# Patient Record
Sex: Male | Born: 1975 | Race: White | Hispanic: No | Smoking: Never smoker
Health system: Southern US, Community
[De-identification: ages and names within clinical notes are randomized; demographics above are authoritative.]

## PROBLEM LIST (undated history)

## (undated) DIAGNOSIS — M5126 Other intervertebral disc displacement, lumbar region: Secondary | ICD-10-CM

## (undated) HISTORY — PX: OTHER SURGICAL HISTORY: SHX169

## (undated) HISTORY — DX: Other intervertebral disc displacement, lumbar region: M51.26

## (undated) HISTORY — PX: KNEE SURGERY: SHX244

---

## 2002-05-12 ENCOUNTER — Inpatient Hospital Stay (HOSPITAL_COMMUNITY): Admission: EM | Admit: 2002-05-12 | Discharge: 2002-05-13 | Payer: Self-pay | Admitting: Emergency Medicine

## 2004-12-27 ENCOUNTER — Emergency Department (HOSPITAL_COMMUNITY): Admission: EM | Admit: 2004-12-27 | Discharge: 2004-12-27 | Payer: Self-pay | Admitting: Emergency Medicine

## 2005-01-23 ENCOUNTER — Ambulatory Visit (HOSPITAL_COMMUNITY): Admission: RE | Admit: 2005-01-23 | Discharge: 2005-01-23 | Payer: Self-pay | Admitting: Orthopedic Surgery

## 2006-01-10 ENCOUNTER — Ambulatory Visit (HOSPITAL_COMMUNITY): Admission: RE | Admit: 2006-01-10 | Discharge: 2006-01-10 | Payer: Self-pay | Admitting: Orthopedic Surgery

## 2013-03-14 ENCOUNTER — Ambulatory Visit (INDEPENDENT_AMBULATORY_CARE_PROVIDER_SITE_OTHER): Payer: Managed Care, Other (non HMO)

## 2013-03-14 ENCOUNTER — Encounter: Payer: Self-pay | Admitting: General Practice

## 2013-03-14 ENCOUNTER — Ambulatory Visit (INDEPENDENT_AMBULATORY_CARE_PROVIDER_SITE_OTHER): Payer: Managed Care, Other (non HMO) | Admitting: General Practice

## 2013-03-14 VITALS — BP 106/66 | HR 56 | Temp 97.4°F | Ht 73.5 in | Wt 216.0 lb

## 2013-03-14 DIAGNOSIS — M545 Low back pain, unspecified: Secondary | ICD-10-CM

## 2013-03-14 DIAGNOSIS — M62838 Other muscle spasm: Secondary | ICD-10-CM

## 2013-03-14 MED ORDER — IBUPROFEN 600 MG PO TABS: 600.0000 mg | ORAL_TABLET | Freq: Three times a day (TID) | ORAL | Status: DC | PRN

## 2013-03-14 MED ORDER — CYCLOBENZAPRINE HCL 5 MG PO TABS: 5.0000 mg | ORAL_TABLET | Freq: Two times a day (BID) | ORAL | Status: DC | PRN

## 2013-03-14 NOTE — Progress Notes (Signed)
  Subjective:    Patient ID: Skanda Worlds, male    DOB: 12-05-1975, 37 y.o.   MRN: [id number]  Back Pain The current episode started more than 1 month ago. The problem occurs constantly. The problem has been gradually worsening since onset. The pain is present in the lumbar spine. The quality of the pain is described as aching (sharp periodically). The pain does not radiate. The pain is at a severity of 4/10. The pain is mild. The pain is the same all the time. The symptoms are aggravated by standing and bending (deep breathing). Pertinent negatives include no abdominal pain, bladder incontinence, bowel incontinence, chest pain, dysuria, fever, headaches, leg pain, numbness, paresthesias, pelvic pain, tingling or weakness. He has tried nothing for the symptoms.  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 Reports back pain when taking a deep breath. Reports having a bowel movement twice daily.     Review of Systems  Constitutional: Negative for fever and chills.  HENT: Negative for neck pain.   Respiratory: Negative for chest tightness and shortness of breath.   Cardiovascular: Negative for chest pain and palpitations.  Gastrointestinal: Negative for abdominal pain and bowel incontinence.  Genitourinary: Negative for bladder incontinence, dysuria and pelvic pain.  Musculoskeletal: Positive for back pain.  Neurological: Negative for dizziness, tingling, weakness, numbness, headaches and paresthesias.       Objective:   Physical Exam  Constitutional: He is oriented to person, place, and time. He appears well-developed and well-nourished.  Cardiovascular: Normal rate, regular rhythm and normal heart sounds.   Pulmonary/Chest: Effort normal and breath sounds normal. No respiratory distress. He exhibits no tenderness.  Abdominal: Soft. Bowel sounds are normal. He exhibits no distension. There is no tenderness.  Musculoskeletal: He exhibits no tenderness.  Neurological: He is alert and  oriented to person, place, and time.  Skin: Skin is warm and dry.  Psychiatric: He has a normal mood and affect.   [facility] reading (PRIMARY) by Coralie Keens, FNP-C, no fracture or dislocation noted.                                       Assessment & Plan:  .1. Low back pain - DG Lumbar Spine 2-3 Views; Future - ibuprofen (ADVIL,MOTRIN) 600 MG tablet; Take 1 tablet (600 mg total) by mouth every 8 (eight) hours as needed for pain.  Dispense: 30 tablet; Refill: 0  2. Muscle spasm - cyclobenzaprine (FLEXERIL) 5 MG tablet; Take 1 tablet (5 mg total) by mouth 2 (two) times daily as needed for muscle spasms.  Dispense: 30 tablet; Refill: 0 -Rest affected area -apply heat to affected area for 10-15 minutes three times daily -RTO if symptoms worsen -will refer to ortho if no improvement -Patient verbalized understanding -Coralie Keens, FNP-C

## 2013-03-14 NOTE — Patient Instructions (Signed)
Back Pain, Adult  Low back pain is very common. About 1 in 5 people have back pain. The cause of low back pain is rarely dangerous. The pain often gets better over time. About half of people with a sudden onset of back pain feel better in just 2 weeks. About 8 in 10 people feel better by 6 weeks.   CAUSES  Some common causes of back pain include:  · Strain of the muscles or ligaments supporting the spine.  · Wear and tear (degeneration) of the spinal discs.  · Arthritis.  · Direct injury to the back.  DIAGNOSIS  Most of the time, the direct cause of low back pain is not known. However, back pain can be treated effectively even when the exact cause of the pain is unknown. Answering your caregiver's questions about your overall health and symptoms is one of the most accurate ways to make sure the cause of your pain is not dangerous. If your caregiver needs more information, he or she may order lab work or imaging tests (X-rays or MRIs). However, even if imaging tests show changes in your back, this usually does not require surgery.  HOME CARE INSTRUCTIONS  For many people, back pain returns. Since low back pain is rarely dangerous, it is often a condition that people can learn to manage on their own.   · Remain active. It is stressful on the back to sit or stand in one place. Do not sit, drive, or stand in one place for more than 30 minutes at a time. Take short walks on level surfaces as soon as pain allows. Try to increase the length of time you walk each day.  · Do not stay in bed. Resting more than 1 or 2 days can delay your recovery.  · Do not avoid exercise or work. Your body is made to move. It is not dangerous to be active, even though your back may hurt. Your back will likely heal faster if you return to being active before your pain is gone.  · Pay attention to your body when you  bend and lift. Many people have less discomfort when lifting if they bend their knees, keep the load close to their bodies, and  avoid twisting. Often, the most comfortable positions are those that put less stress on your recovering back.  · Find a comfortable position to sleep. Use a firm mattress and lie on your side with your knees slightly bent. If you lie on your back, put a pillow under your knees.  · Only take over-the-counter or prescription medicines as directed by your caregiver. Over-the-counter medicines to reduce pain and inflammation are often the most helpful. Your caregiver may prescribe muscle relaxant drugs. These medicines help dull your pain so you can more quickly return to your normal activities and healthy exercise.  · Put ice on the injured area.  · Put ice in a plastic bag.  · Place a towel between your skin and the bag.  · Leave the ice on for 15-20 minutes, 3-4 times a day for the first 2 to 3 days. After that, ice and heat may be alternated to reduce pain and spasms.  · Ask your caregiver about trying back exercises and gentle massage. This may be of some benefit.  · Avoid feeling anxious or stressed. Stress increases muscle tension and can worsen back pain. It is important to recognize when you are anxious or stressed and learn ways to manage it. Exercise is a great option.  SEEK MEDICAL CARE IF:  · You have pain that is not relieved with rest or   medicine.  · You have pain that does not improve in 1 week.  · You have new symptoms.  · You are generally not feeling well.  SEEK IMMEDIATE MEDICAL CARE IF:   · You have pain that radiates from your back into your legs.  · You develop new bowel or bladder control problems.  · You have unusual weakness or numbness in your arms or legs.  · You develop nausea or vomiting.  · You develop abdominal pain.  · You feel faint.  Document Released: 09/11/2005 Document Revised: 03/12/2012 Document Reviewed: 01/30/2011  ExitCare® Patient Information ©2014 [employer].

## 2013-03-19 ENCOUNTER — Telehealth: Payer: Self-pay | Admitting: General Practice

## 2013-03-19 NOTE — Telephone Encounter (Signed)
I attempted to contact patient without success, if he calls back please inform, Minimal degenerative changes T10-11 and T11-12 disc space. I will refer to ortho specialist, if still have pain or discomfort. thx

## 2013-03-19 NOTE — Telephone Encounter (Signed)
Dup note  

## 2013-03-19 NOTE — Telephone Encounter (Signed)
Please review x-ray and I will contact pt.

## 2013-03-20 ENCOUNTER — Telehealth: Payer: Self-pay | Admitting: General Practice

## 2013-03-20 NOTE — Telephone Encounter (Signed)
Pt wants referral.Please send request.

## 2013-03-20 NOTE — Telephone Encounter (Signed)
Pt aware.

## 2013-03-20 NOTE — Telephone Encounter (Signed)
Spoke with patient and informed of xray results. Patient reports still having discomfort but denies wanting to see an ortho specialist at this time. He will call for referral if he decides to see specialist.

## 2013-07-15 ENCOUNTER — Ambulatory Visit (INDEPENDENT_AMBULATORY_CARE_PROVIDER_SITE_OTHER): Payer: Managed Care, Other (non HMO) | Admitting: General Practice

## 2013-07-15 ENCOUNTER — Encounter (INDEPENDENT_AMBULATORY_CARE_PROVIDER_SITE_OTHER): Payer: Self-pay

## 2013-07-15 ENCOUNTER — Encounter: Payer: Self-pay | Admitting: General Practice

## 2013-07-15 VITALS — BP 109/68 | HR 80 | Temp 100.3°F | Ht 74.0 in | Wt 212.0 lb

## 2013-07-15 DIAGNOSIS — J029 Acute pharyngitis, unspecified: Secondary | ICD-10-CM

## 2013-07-15 DIAGNOSIS — J020 Streptococcal pharyngitis: Secondary | ICD-10-CM

## 2013-07-15 LAB — POCT RAPID STREP A (OFFICE): Rapid Strep A Screen: POSITIVE — AB

## 2013-07-15 MED ORDER — AMOXICILLIN 875 MG PO TABS: 875.0000 mg | ORAL_TABLET | Freq: Two times a day (BID) | ORAL | Status: DC

## 2013-07-15 NOTE — Progress Notes (Signed)
  Subjective:    Patient ID: Vincent Durham, male    DOB: [birth year], 37 y.o.   MRN: [id number]  Sore Throat  This is a new problem. The current episode started yesterday. The problem has been gradually worsening. Neither side of throat is experiencing more pain than the other. There has been no fever. The pain is at a severity of 7/10. Pertinent negatives include no congestion, coughing, ear pain, headaches, plugged ear sensation, neck pain, shortness of breath or vomiting. He has had no exposure to strep or mono. He has tried nothing for the symptoms.      Review of Systems  Constitutional: Negative for fever and chills.  HENT: Positive for sore throat. Negative for congestion, ear pain, postnasal drip and sinus pressure.   Respiratory: Negative for cough, chest tightness and shortness of breath.   Gastrointestinal: Negative for nausea and vomiting.  Musculoskeletal: Negative for neck pain.  Neurological: Negative for dizziness, weakness and headaches.       Objective:   Physical Exam  Constitutional: He is oriented to person, place, and time. He appears well-developed and well-nourished.  HENT:  Head: Normocephalic and atraumatic.  Right Ear: External ear normal.  Left Ear: External ear normal.  Nose: Nose normal.  Mouth/Throat: Posterior oropharyngeal erythema present.  Cardiovascular: Normal rate, regular rhythm and normal heart sounds.   Pulmonary/Chest: Effort normal and breath sounds normal.  Neurological: He is alert and oriented to person, place, and time.  Skin: Skin is warm and dry.  Psychiatric: He has a normal mood and affect.          Assessment & Plan:  1. Sore throat  - POCT rapid strep A  2. Streptococcal sore throat  - amoxicillin (AMOXIL) 875 MG tablet; Take 1 tablet (875 mg total) by mouth 2 (two) times daily.  Dispense: 20 tablet; Refill: 0 -Increase fluid intake Motrin or tylenol OTC OTC decongestant Throat lozenges if help New toothbrush in 3  days Proper handwashing RTO if symptoms worsen or unresolved Patient verbalized understanding Coralie Keens, FNP-C

## 2013-07-15 NOTE — Patient Instructions (Signed)
Strep Throat  Strep throat is an infection of the throat caused by a bacteria named Streptococcus pyogenes. Your caregiver may call the infection streptococcal "tonsillitis" or "pharyngitis" depending on whether there are signs of inflammation in the tonsils or back of the throat. Strep throat is most common in children aged 35 15 years during the cold months of the year, but it can occur in people of any age during any season. This infection is spread from person to person (contagious) through coughing, sneezing, or other close contact.  SYMPTOMS   · Fever or chills.  · Painful, swollen, red tonsils or throat.  · Pain or difficulty when swallowing.  · White or yellow spots on the tonsils or throat.  · Swollen, tender lymph nodes or "glands" of the neck or under the jaw.  · Red rash all over the body (rare).  DIAGNOSIS   Many different infections can cause the same symptoms. A test must be done to confirm the diagnosis so the right treatment can be given. A "rapid strep test" can help your caregiver make the diagnosis in a few minutes. If this test is not available, a light swab of the infected area can be used for a throat culture test. If a throat culture test is done, results are usually available in a day or two.  TREATMENT   Strep throat is treated with antibiotic medicine.  HOME CARE INSTRUCTIONS   · Gargle with 1 tsp of salt in 1 cup of warm water, 3 4 times per day or as needed for comfort.  · Family members who also have a sore throat or fever should be tested for strep throat and treated with antibiotics if they have the strep infection.  · Make sure everyone in your household washes their hands well.  · Do not share food, drinking cups, or personal items that could cause the infection to spread to others.  · You may need to eat a soft food diet until your sore throat gets better.  · Drink enough water and fluids to keep your urine clear or pale yellow. This will help prevent dehydration.  · Get plenty of  rest.  · Stay home from school, daycare, or work until you have been on antibiotics for 24 hours.  · Only take over-the-counter or prescription medicines for pain, discomfort, or fever as directed by your caregiver.  · If antibiotics are prescribed, take them as directed. Finish them even if you start to feel better.  SEEK MEDICAL CARE IF:   · The glands in your neck continue to enlarge.  · You develop a rash, cough, or earache.  · You cough up green, yellow-brown, or bloody sputum.  · You have pain or discomfort not controlled by medicines.  · Your problems seem to be getting worse rather than better.  SEEK IMMEDIATE MEDICAL CARE IF:   · You develop any new symptoms such as vomiting, severe headache, stiff or painful neck, chest pain, shortness of breath, or trouble swallowing.  · You develop severe throat pain, drooling, or changes in your voice.  · You develop swelling of the neck, or the skin on the neck becomes red and tender.  · You have a fever.  · You develop signs of dehydration, such as fatigue, dry mouth, and decreased urination.  · You become increasingly sleepy, or you cannot wake up completely.  Document Released: 09/08/2000 Document Revised: 08/28/2012 Document Reviewed: 11/10/2010  ExitCare® Patient Information ©2014 [employer].

## 2014-06-04 ENCOUNTER — Encounter: Payer: Self-pay | Admitting: Family Medicine

## 2014-06-04 ENCOUNTER — Ambulatory Visit (INDEPENDENT_AMBULATORY_CARE_PROVIDER_SITE_OTHER): Payer: Managed Care, Other (non HMO) | Admitting: Family Medicine

## 2014-06-04 ENCOUNTER — Encounter (INDEPENDENT_AMBULATORY_CARE_PROVIDER_SITE_OTHER): Payer: Self-pay

## 2014-06-04 VITALS — BP 115/72 | HR 68 | Temp 97.9°F | Ht 74.0 in | Wt 221.0 lb

## 2014-06-04 DIAGNOSIS — Z0000 Encounter for general adult medical examination without abnormal findings: Secondary | ICD-10-CM

## 2014-06-04 LAB — GLUCOSE, POCT (MANUAL RESULT ENTRY): POC Glucose: 88 mg/dl (ref 70–99)

## 2014-06-04 NOTE — Patient Instructions (Signed)

## 2014-06-04 NOTE — Progress Notes (Signed)
   Subjective:    Patient ID: Vincent Durham, male    DOB: 1975-11-08, 38 y.o.   MRN: [id number]  HPI  70 your gentleman here for a company physical. He works for [employer] and he is asked to have a physical exam and a biometric profile including blood glucose lipid panel BMI blood pressure. He is basically healthy and has no complaints.    Review of Systems  Constitutional: Negative.   HENT: Negative.   Eyes: Negative.   Respiratory: Negative.  Negative for shortness of breath.   Cardiovascular: Negative.  Negative for chest pain and leg swelling.  Gastrointestinal: Negative.   Genitourinary: Negative.   Musculoskeletal: Negative.   Skin: Negative.   Neurological: Negative.   Psychiatric/Behavioral: Negative.   All other systems reviewed and are negative.      Objective:   Physical Exam  Constitutional: He is oriented to person, place, and time. He appears well-developed and well-nourished.  HENT:  Head: Normocephalic.  Right Ear: External ear normal.  Left Ear: External ear normal.  Nose: Nose normal.  Mouth/Throat: Oropharynx is clear and moist.  Eyes: Conjunctivae and EOM are normal. Pupils are equal, round, and reactive to light.  Neck: Normal range of motion. Neck supple.  Cardiovascular: Normal rate, regular rhythm, normal heart sounds and intact distal pulses.   Pulmonary/Chest: Effort normal and breath sounds normal.  Abdominal: Soft. Bowel sounds are normal.  Musculoskeletal: Normal range of motion.  Neurological: He is alert and oriented to person, place, and time.  Skin: Skin is warm and dry.  Psychiatric: He has a normal mood and affect. His behavior is normal. Judgment and thought content normal.    BP 115/72  Pulse 68  Temp(Src) 97.9 F (36.6 C) (Oral)  Ht  (1.88 m)  Wt 221 lb (100.245 kg)  BMI 28.36 kg/m2      Assessment & Plan:  1. Wellness examination  - Lipid panel - POCT glucose (manual entry)  2. Routine general medical  examination at a health care facility Frederica Kuster MD

## 2014-06-05 LAB — LIPID PANEL
CHOL/HDL RATIO: 3.9 ratio (ref 0.0–5.0)
Cholesterol, Total: 161 mg/dL (ref 100–199)
HDL: 41 mg/dL (ref >39)
LDL CALC: 90 mg/dL (ref 0–99)
TRIGLYCERIDES: 152 mg/dL — AB (ref 0–149)
VLDL CHOLESTEROL CAL: 30 mg/dL (ref 5–40)

## 2014-08-11 ENCOUNTER — Ambulatory Visit (INDEPENDENT_AMBULATORY_CARE_PROVIDER_SITE_OTHER): Payer: Managed Care, Other (non HMO) | Admitting: Family Medicine

## 2014-08-11 ENCOUNTER — Telehealth: Payer: Self-pay | Admitting: Family Medicine

## 2014-08-11 VITALS — BP 113/75 | HR 67 | Temp 98.1°F | Ht 74.0 in | Wt 214.6 lb

## 2014-08-11 DIAGNOSIS — M5440 Lumbago with sciatica, unspecified side: Secondary | ICD-10-CM

## 2014-08-11 MED ORDER — NAPROXEN 500 MG PO TABS: 500.0000 mg | ORAL_TABLET | Freq: Two times a day (BID) | ORAL | Status: DC

## 2014-08-11 MED ORDER — CYCLOBENZAPRINE HCL 10 MG PO TABS: 10.0000 mg | ORAL_TABLET | Freq: Three times a day (TID) | ORAL | Status: DC | PRN

## 2014-08-11 NOTE — Telephone Encounter (Signed)
Pt given appt today @ 10:30 with Ander SladeBill [place]

## 2014-08-11 NOTE — Progress Notes (Signed)
   Subjective:    Patient ID: Vincent Durham, male    DOB: [birth year], 38 y.o.   MRN: [id number]  HPI Patient is here for c/o back pain.  Review of Systems  Constitutional: Negative for fever.  HENT: Negative for ear pain.   Eyes: Negative for discharge.  Respiratory: Negative for cough.   Cardiovascular: Negative for chest pain.  Gastrointestinal: Negative for abdominal distention.  Endocrine: Negative for polyuria.  Genitourinary: Negative for difficulty urinating.  Musculoskeletal: Negative for gait problem and neck pain.  Skin: Negative for color change and rash.  Neurological: Negative for speech difficulty and headaches.  Psychiatric/Behavioral: Negative for agitation.       Objective:    BP 113/75 mmHg  Pulse 67  Temp(Src) 98.1 F (36.7 C) (Oral)  Ht 6\' 2"  (1.88 m)  Wt 214 lb 9.6 oz (97.342 kg)  BMI 27.54 kg/m2   Physical Exam  Constitutional: He is oriented to person, place, and time. He appears well-developed and well-nourished.  HENT:  Head: Normocephalic and atraumatic.  Mouth/Throat: Oropharynx is clear and moist.  Eyes: Pupils are equal, round, and reactive to light.  Neck: Normal range of motion. Neck supple.  Cardiovascular: Normal rate and regular rhythm.   No murmur heard. Pulmonary/Chest: Effort normal and breath sounds normal.  Abdominal: Soft. Bowel sounds are normal. There is no tenderness.  Neurological: He is alert and oriented to person, place, and time.  Skin: Skin is warm and dry.  Psychiatric: He has a normal mood and affect.          Assessment & Plan:     ICD-9-CM ICD-10-CM   1. Midline low back pain with sciatica, sciatica laterality unspecified 724.3 M54.40 naproxen (NAPROSYN) 500 MG tablet     cyclobenzaprine (FLEXERIL) 10 MG tablet     Return if symptoms worsen or fail to improve.  Deatra CanterWilliam J Leeyah Heather FNP

## 2014-08-12 ENCOUNTER — Telehealth: Payer: Self-pay | Admitting: Family Medicine

## 2014-08-12 ENCOUNTER — Encounter: Payer: Self-pay | Admitting: *Deleted

## 2014-08-12 NOTE — Telephone Encounter (Signed)
Spoke with Annette StableBill about another note for work, Annette StableBill ok'd putting him out until Monday, pt aware will leave letter up front to pick up.

## 2015-06-07 ENCOUNTER — Encounter: Payer: Self-pay | Admitting: Family Medicine

## 2015-06-07 ENCOUNTER — Ambulatory Visit (INDEPENDENT_AMBULATORY_CARE_PROVIDER_SITE_OTHER): Payer: Managed Care, Other (non HMO) | Admitting: Family Medicine

## 2015-06-07 VITALS — BP 106/71 | HR 48 | Temp 97.1°F | Ht 74.0 in | Wt 215.0 lb

## 2015-06-07 DIAGNOSIS — Z13220 Encounter for screening for lipoid disorders: Secondary | ICD-10-CM

## 2015-06-07 DIAGNOSIS — Z0000 Encounter for general adult medical examination without abnormal findings: Secondary | ICD-10-CM | POA: Diagnosis not present

## 2015-06-07 DIAGNOSIS — Z131 Encounter for screening for diabetes mellitus: Secondary | ICD-10-CM

## 2015-06-07 DIAGNOSIS — Z8349 Family history of other endocrine, nutritional and metabolic diseases: Secondary | ICD-10-CM

## 2015-06-07 NOTE — Progress Notes (Signed)
BP 106/71 mmHg  Pulse 48  Temp(Src) 97.1 F (36.2 C) (Oral)  Ht _0  (1.88 m)  Wt 215 lb (97.523 kg)  BMI 27.59 kg/m2   Subjective:    Patient ID: Vincent Durham, male    DOB: 1975-12-06, 39 y.o.   MRN: [id number]  HPI: Vincent Durham is a 39 y.o. male presenting on 06/07/2015 for Annual Exam and labwork   HPI Normal exam Patient presents today for general annual well adult exam and screening labs. He is concerned because he has heart disease and thyroid disease in his family would like to be tested for these. He does not have any medical history himself. He denies any lightheadedness, dizziness, headaches, chest pain, shortness of breath, abdominal pain, diarrhea, constipation, nausea or vomiting. Denies any urinary problems.  Relevant past medical, surgical, family and social history reviewed and updated as indicated. Interim medical history since our last visit reviewed. Allergies and medications reviewed and updated.  Review of Systems  Constitutional: Negative for fever, chills and unexpected weight change.  HENT: Negative for congestion, ear discharge, ear pain, postnasal drip, rhinorrhea, sinus pressure and voice change.   Eyes: Negative for pain, discharge and visual disturbance.  Respiratory: Negative for cough, chest tightness, shortness of breath and wheezing.   Cardiovascular: Negative for chest pain, palpitations and leg swelling.  Gastrointestinal: Negative for nausea, vomiting, abdominal pain, diarrhea and constipation.  Genitourinary: Negative for dysuria and difficulty urinating.  Musculoskeletal: Negative for back pain and gait problem.  Skin: Negative for rash.  Neurological: Negative for dizziness, syncope, light-headedness and headaches.  All other systems reviewed and are negative.   Per HPI unless specifically indicated above     Medication List    Notice  As of 06/07/2015 11:38 AM   You have not been prescribed any medications.           Objective:    BP 106/71 mmHg  Pulse 48  Temp(Src) 97.1 F (36.2 C) (Oral)  Ht _1  (1.88 m)  Wt 215 lb (97.523 kg)  BMI 27.59 kg/m2  Wt Readings from Last 3 Encounters:  06/07/15 215 lb (97.523 kg)  08/11/14 214 lb 9.6 oz (97.342 kg)  06/04/14 221 lb (100.245 kg)    Physical Exam  Constitutional: He is oriented to person, place, and time. He appears well-developed and well-nourished. No distress.  HENT:  Right Ear: External ear normal.  Left Ear: External ear normal.  Nose: Nose normal.  Mouth/Throat: Oropharynx is clear and moist. No oropharyngeal exudate.  Eyes: Conjunctivae and EOM are normal. Pupils are equal, round, and reactive to light. Right eye exhibits no discharge. No scleral icterus.  Neck: Neck supple. No thyromegaly present.  Cardiovascular: Normal rate, regular rhythm, normal heart sounds and intact distal pulses.   No murmur heard. Pulmonary/Chest: Effort normal and breath sounds normal. No respiratory distress. He has no wheezes.  Abdominal: Soft. He exhibits no distension. There is no tenderness.  Musculoskeletal: Normal range of motion. He exhibits no edema or tenderness.  Lymphadenopathy:    He has no cervical adenopathy.  Neurological: He is alert and oriented to person, place, and time. Coordination normal.  Skin: Skin is warm and dry. No rash noted. He is not diaphoretic.  Psychiatric: He has a normal mood and affect. His behavior is normal.  Vitals reviewed.   Results for orders placed or performed in visit on 06/04/14  Lipid panel  Result Value Ref Range   Cholesterol, Total 161 100 - 199 mg/dL  Triglycerides 152 (H) 0 - 149 mg/dL   HDL 41 >39 mg/dL   VLDL Cholesterol Cal 30 5 - 40 mg/dL   LDL Calculated 90 0 - 99 mg/dL   Chol/HDL Ratio 3.9 0.0 - 5.0 ratio units  POCT glucose (manual entry)  Result Value Ref Range   POC Glucose 88 70 - 99 mg/dl      Assessment & Plan:   Problem List Items Addressed This Visit      Other   Routine  general medical examination at a health care facility - Primary    Other Visit Diagnoses    Screening for diabetes mellitus        Relevant Orders    CMP14+EGFR (Completed)    Family history of thyroid disease        Relevant Orders    Thyroid Panel With TSH (Completed)    Screening for lipoid disorders        Relevant Orders    Lipid panel (Completed)        Follow up plan: Return in about 1 year (around 06/06/2016), or if symptoms worsen or fail to improve.  Caryl Pina, MD [facility] 06/07/2015, 11:38 AM

## 2015-06-08 LAB — CMP14+EGFR
ALK PHOS: 64 IU/L (ref 39–117)
ALT: 38 IU/L (ref 0–44)
AST: 31 IU/L (ref 0–40)
Albumin/Globulin Ratio: 2 (ref 1.1–2.5)
Albumin: 4.7 g/dL (ref 3.5–5.5)
BILIRUBIN TOTAL: 1.1 mg/dL (ref 0.0–1.2)
BUN/Creatinine Ratio: 11 (ref 8–19)
BUN: 13 mg/dL (ref 6–20)
CHLORIDE: 100 mmol/L (ref 97–108)
CO2: 24 mmol/L (ref 18–29)
Calcium: 9.6 mg/dL (ref 8.7–10.2)
Creatinine, Ser: 1.23 mg/dL (ref 0.76–1.27)
GFR calc Af Amer: 86 mL/min/{1.73_m2} (ref >59)
GFR calc non Af Amer: 75 mL/min/{1.73_m2} (ref >59)
GLUCOSE: 81 mg/dL (ref 65–99)
Globulin, Total: 2.3 g/dL (ref 1.5–4.5)
Potassium: 4.2 mmol/L (ref 3.5–5.2)
Sodium: 139 mmol/L (ref 134–144)
Total Protein: 7 g/dL (ref 6.0–8.5)

## 2015-06-08 LAB — THYROID PANEL WITH TSH
FREE THYROXINE INDEX: 2.9 (ref 1.2–4.9)
T3 Uptake Ratio: 32 % (ref 24–39)
T4 TOTAL: 9.1 ug/dL (ref 4.5–12.0)
TSH: 2.24 u[IU]/mL (ref 0.450–4.500)

## 2015-06-08 LAB — LIPID PANEL
Chol/HDL Ratio: 3.5 ratio units (ref 0.0–5.0)
Cholesterol, Total: 170 mg/dL (ref 100–199)
HDL: 49 mg/dL (ref >39)
LDL Calculated: 102 mg/dL — ABNORMAL HIGH (ref 0–99)
Triglycerides: 97 mg/dL (ref 0–149)
VLDL Cholesterol Cal: 19 mg/dL (ref 5–40)

## 2015-06-09 ENCOUNTER — Telehealth: Payer: Self-pay | Admitting: Family Medicine

## 2015-06-09 NOTE — Telephone Encounter (Signed)
Pt needs specific lab values for paper for work. Copy printed and put at front desk for pickup.

## 2016-05-01 ENCOUNTER — Ambulatory Visit (INDEPENDENT_AMBULATORY_CARE_PROVIDER_SITE_OTHER): Payer: Managed Care, Other (non HMO)

## 2016-05-01 ENCOUNTER — Encounter: Payer: Self-pay | Admitting: Family Medicine

## 2016-05-01 ENCOUNTER — Ambulatory Visit (INDEPENDENT_AMBULATORY_CARE_PROVIDER_SITE_OTHER): Payer: Managed Care, Other (non HMO) | Admitting: Family Medicine

## 2016-05-01 VITALS — BP 112/64 | HR 78 | Temp 97.4°F | Ht 74.0 in | Wt 220.0 lb

## 2016-05-01 DIAGNOSIS — M542 Cervicalgia: Secondary | ICD-10-CM

## 2016-05-01 MED ORDER — CYCLOBENZAPRINE HCL 10 MG PO TABS: 10.0000 mg | ORAL_TABLET | Freq: Three times a day (TID) | ORAL | 0 refills | Status: DC | PRN

## 2016-05-01 MED ORDER — MELOXICAM 15 MG PO TABS: 15.0000 mg | ORAL_TABLET | Freq: Every day | ORAL | 0 refills | Status: DC

## 2016-05-01 NOTE — Progress Notes (Signed)
BP 112/64   Pulse 78   Temp 97.4 F (36.3 C) (Oral)   Ht  (1.88 m)   Wt 220 lb (99.8 kg)   BMI 28.25 kg/m    Subjective:    Patient ID: Vincent Durham, male    DOB: 12-05-1975, 40 y.o.   MRN: [id number]  HPI: Vincent Durham is a 40 y.o. male presenting on 05/01/2016 for Neck Pain (pt has been having neck pain on and off for the past few weeks after going sky diving and had a hard landing.)   HPI Neck pain Patient has been having neck pain, specifically on the right side of his neck/upper back that is been going on for the past couple weeks. He says he went skydiving 2 months ago and had a hard fall and did not know if that would be related to this or not but did not have any issues immediately after that. He denies any fevers or chills or overlying skin changes. He denies any numbness or weakness going down either of his arms or legs. He denies any loss of range of motion of his neck but is just very stiff especially with rotation side-to-side.  Relevant past medical, surgical, family and social history reviewed and updated as indicated. Interim medical history since our last visit reviewed. Allergies and medications reviewed and updated.  Review of Systems  Constitutional: Negative for fever.  HENT: Negative for ear discharge and ear pain.   Eyes: Negative for discharge and visual disturbance.  Respiratory: Negative for shortness of breath and wheezing.   Cardiovascular: Negative for chest pain and leg swelling.  Gastrointestinal: Negative for abdominal pain, constipation and diarrhea.  Genitourinary: Negative for difficulty urinating.  Musculoskeletal: Positive for myalgias, neck pain and neck stiffness. Negative for back pain and gait problem.  Skin: Negative for rash.  Neurological: Negative for syncope, weakness, light-headedness, numbness and headaches.  All other systems reviewed and are negative.   Per HPI unless specifically indicated above     Medication  List       Accurate as of 05/01/16  8:47 AM. Always use your most recent med list.          cyclobenzaprine 10 MG tablet Commonly known as:  FLEXERIL Take 1 tablet (10 mg total) by mouth 3 (three) times daily as needed for muscle spasms.   meloxicam 15 MG tablet Commonly known as:  MOBIC Take 1 tablet (15 mg total) by mouth daily.          Objective:    BP 112/64   Pulse 78   Temp 97.4 F (36.3 C) (Oral)   Ht  (1.88 m)   Wt 220 lb (99.8 kg)   BMI 28.25 kg/m   Wt Readings from Last 3 Encounters:  05/01/16 220 lb (99.8 kg)  06/07/15 215 lb (97.5 kg)  08/11/14 214 lb 9.6 oz (97.3 kg)    Physical Exam  Constitutional: He is oriented to person, place, and time. He appears well-developed and well-nourished. No distress.  Eyes: Conjunctivae and EOM are Vincent. Pupils are equal, round, and reactive to light. Right eye exhibits no discharge. No scleral icterus.  Neck: Neck supple. No thyromegaly present.  Cardiovascular: Vincent rate, regular rhythm, Vincent heart sounds and intact distal pulses.   No murmur heard. Pulmonary/Chest: Effort Vincent and breath sounds Vincent. No respiratory distress. He has no wheezes.  Musculoskeletal: Vincent range of motion. He exhibits no edema.       Cervical back: He  exhibits tenderness. He exhibits Vincent range of motion, no bony tenderness, no edema and no deformity.       Back:  Patient does not have any numbness or weakness in either of his upper extremities or his legs.  Lymphadenopathy:    He has no cervical adenopathy.  Neurological: He is alert and oriented to person, place, and time. Coordination Vincent.  Skin: Skin is warm and dry. No rash noted. He is not diaphoretic.  Psychiatric: He has a Vincent mood and affect. His behavior is Vincent.  Nursing note and vitals reviewed.     Assessment & Plan:   Problem List Items Addressed This Visit    None    Visit Diagnoses    Neck pain    -  Primary   No sign of fracture on  x-ray, will send muscle relaxer and meloxicam and is instructed to stretch and use tennis ball massage   Relevant Medications   cyclobenzaprine (FLEXERIL) 10 MG tablet   meloxicam (MOBIC) 15 MG tablet   Other Relevant Orders   DG Cervical Spine Complete       Follow up plan: Return if symptoms worsen or fail to improve.  Counseling provided for all of the vaccine components Orders Placed This Encounter  Procedures  . DG Cervical Spine Complete    Arville CareJoshua Dettinger, MD [facility] 05/01/2016, 8:47 AM

## 2016-05-02 ENCOUNTER — Encounter: Payer: Self-pay | Admitting: Nurse Practitioner

## 2016-05-02 ENCOUNTER — Ambulatory Visit (INDEPENDENT_AMBULATORY_CARE_PROVIDER_SITE_OTHER): Payer: Managed Care, Other (non HMO) | Admitting: Nurse Practitioner

## 2016-05-02 VITALS — BP 103/67 | HR 76 | Temp 97.3°F | Ht 74.0 in | Wt 223.0 lb

## 2016-05-02 DIAGNOSIS — M542 Cervicalgia: Secondary | ICD-10-CM

## 2016-05-02 MED ORDER — METHYLPREDNISOLONE ACETATE 80 MG/ML IJ SUSP
80.0000 mg | Freq: Once | INTRAMUSCULAR | Status: AC
  Administered 2016-05-02: 80 mg via INTRAMUSCULAR

## 2016-05-02 NOTE — Progress Notes (Signed)
   Subjective:    Patient ID: Vincent Durham, male    DOB: [birth year], 40 y.o.   MRN: [id number]  HPI  Patient in today c/o neck pain- started Sunday morning when he woke up. Hurts to move neck at all- as been using ice. He came in yesterday and saw Dr. Louanne Skyeettinger. Xrays were negative. He gave him mobic and flexeril. Went to see chiropractor yesterday evening as suggested by [employer] worker. The chiropractor "Popped" his neck and that made it feel looser but at 3am this morning he could not hardly move due to pain. He tried to go to work this morning but was not able tod his job. Denies any numbness or tingling in upper ext.   Review of Systems  Constitutional: Negative.   HENT: Negative.   Respiratory: Negative.   Cardiovascular: Negative.   Gastrointestinal: Negative.   Neurological: Positive for tremors. Negative for dizziness, weakness and numbness.  Psychiatric/Behavioral: Negative.   All other systems reviewed and are negative.      Objective:   Physical Exam  Constitutional: He is oriented to person, place, and time. He appears well-developed and well-nourished. No distress.  Cardiovascular: Normal rate, regular rhythm and normal heart sounds.   Pulmonary/Chest: Effort normal.  Musculoskeletal:  Patient sitting very still- moves his entire body to move head. Pain with any movement of neck but worsens with rotation. Grips equal bil Motor and sensation intact bil upper ext.  Neurological: He is alert and oriented to person, place, and time. He has normal reflexes. No cranial nerve deficit.  Skin: Skin is warm.  Psychiatric: He has a normal mood and affect. His behavior is normal. Judgment and thought content normal.   BP 103/67   Pulse 76   Temp 97.3 F (36.3 C) (Oral)   Ht 6\' 2"  (1.88 m)   Wt 223 lb (101.2 kg)   BMI 28.63 kg/m      Assessment & Plan:  1. Neck pain Meds ordered this encounter  Medications  . methylPREDNISolone acetate (DEPO-MEDROL) injection 80 mg    Continue flexeril and mobic as rx Moist heat Rest  out of work till Monday  Bennie PieriniMary-Margaret Leasa Kincannon, FNP

## 2016-05-16 ENCOUNTER — Encounter: Payer: Self-pay | Admitting: Nurse Practitioner

## 2016-05-16 ENCOUNTER — Ambulatory Visit (INDEPENDENT_AMBULATORY_CARE_PROVIDER_SITE_OTHER): Payer: Managed Care, Other (non HMO) | Admitting: Nurse Practitioner

## 2016-05-16 VITALS — BP 111/76 | HR 80 | Temp 97.1°F | Ht 74.0 in | Wt 217.0 lb

## 2016-05-16 DIAGNOSIS — Z0000 Encounter for general adult medical examination without abnormal findings: Secondary | ICD-10-CM

## 2016-05-16 NOTE — Patient Instructions (Signed)

## 2016-05-16 NOTE — Progress Notes (Signed)
   Subjective:    Patient ID: Vincent Durham, male    DOB: [birth year], 40 y.o.   MRN: [id number]  HPI Patient in today for CPE - he has no current medical problems and is on no meds. He reports feeling fine, but report occasional neck pain. Was prescribed flexeiril and meloxicam at his last visit but has not been taking them.    Review of Systems  Constitutional: Negative.   HENT: Negative.   Eyes: Negative.   Respiratory: Negative.   Cardiovascular: Negative.   Gastrointestinal: Negative.   Endocrine: Negative.   Musculoskeletal: Negative.   Skin: Negative.   Allergic/Immunologic: Negative.        Objective:   Physical Exam  Constitutional: He is oriented to person, place, and time. He appears well-developed and well-nourished. No distress.  HENT:  Head: Normocephalic.  Right Ear: External ear normal.  Left Ear: External ear normal.  Nose: Nose normal.  Mouth/Throat: Oropharynx is clear and moist.  Eyes: EOM are normal. Pupils are equal, round, and reactive to light.  Neck: Normal range of motion. Neck supple. No JVD present. No thyromegaly present.  Cardiovascular: Normal rate, regular rhythm, normal heart sounds and intact distal pulses.  Exam reveals no gallop and no friction rub.   No murmur heard. Pulmonary/Chest: Effort normal and breath sounds normal. No respiratory distress. He has no wheezes. He has no rales. He exhibits no tenderness.  Abdominal: Soft. Bowel sounds are normal. He exhibits no mass. There is no tenderness.  Genitourinary: Prostate normal and penis normal.  Musculoskeletal: Normal range of motion. He exhibits no edema.  Lymphadenopathy:    He has no cervical adenopathy.  Neurological: He is alert and oriented to person, place, and time. No cranial nerve deficit.  Skin: Skin is warm and dry.  Psychiatric: He has a normal mood and affect. His behavior is normal. Judgment and thought content normal.    BP 111/76   Pulse 80   Temp 97.1 F (36.2 C)  (Oral)   Ht _0  (1.88 m)   Wt 217 lb (98.4 kg)   BMI 27.86 kg/m '      Assessment & Plan:  1. Annual physical exam - CMP14+EGFR - Lipid panel - CBC with Differential/Platelet    Labs pending Health maintenance reviewed Diet and exercise encouraged Continue all meds Follow up  In 1 years    [place], FNP

## 2016-05-17 LAB — LIPID PANEL
Chol/HDL Ratio: 3.3 ratio units (ref 0.0–5.0)
Cholesterol, Total: 153 mg/dL (ref 100–199)
HDL: 46 mg/dL (ref >39)
LDL Calculated: 90 mg/dL (ref 0–99)
Triglycerides: 85 mg/dL (ref 0–149)
VLDL CHOLESTEROL CAL: 17 mg/dL (ref 5–40)

## 2016-05-17 LAB — CBC WITH DIFFERENTIAL/PLATELET
BASOS: 1 %
Basophils Absolute: 0 10*3/uL (ref 0.0–0.2)
EOS (ABSOLUTE): 0.1 10*3/uL (ref 0.0–0.4)
EOS: 2 %
HEMATOCRIT: 52.9 % — AB (ref 37.5–51.0)
Hemoglobin: 18 g/dL — ABNORMAL HIGH (ref 12.6–17.7)
Immature Grans (Abs): 0 10*3/uL (ref 0.0–0.1)
Immature Granulocytes: 0 %
LYMPHS ABS: 1.7 10*3/uL (ref 0.7–3.1)
Lymphs: 19 %
MCH: 30.4 pg (ref 26.6–33.0)
MCHC: 34 g/dL (ref 31.5–35.7)
MCV: 89 fL (ref 79–97)
MONOS ABS: 0.6 10*3/uL (ref 0.1–0.9)
Monocytes: 7 %
Neutrophils Absolute: 6.3 10*3/uL (ref 1.4–7.0)
Neutrophils: 71 %
Platelets: 222 10*3/uL (ref 150–379)
RBC: 5.92 x10E6/uL — ABNORMAL HIGH (ref 4.14–5.80)
RDW: 13.2 % (ref 12.3–15.4)
WBC: 8.8 10*3/uL (ref 3.4–10.8)

## 2016-05-17 LAB — CMP14+EGFR
A/G RATIO: 1.8 (ref 1.2–2.2)
ALK PHOS: 71 IU/L (ref 39–117)
ALT: 37 IU/L (ref 0–44)
AST: 30 IU/L (ref 0–40)
Albumin: 4.8 g/dL (ref 3.5–5.5)
BILIRUBIN TOTAL: 0.8 mg/dL (ref 0.0–1.2)
BUN/Creatinine Ratio: 12 (ref 9–20)
BUN: 15 mg/dL (ref 6–20)
CHLORIDE: 100 mmol/L (ref 96–106)
CO2: 25 mmol/L (ref 18–29)
Calcium: 9.7 mg/dL (ref 8.7–10.2)
Creatinine, Ser: 1.21 mg/dL (ref 0.76–1.27)
GFR calc non Af Amer: 75 mL/min/{1.73_m2} (ref >59)
GFR, EST AFRICAN AMERICAN: 87 mL/min/{1.73_m2} (ref >59)
GLUCOSE: 93 mg/dL (ref 65–99)
Globulin, Total: 2.6 g/dL (ref 1.5–4.5)
Potassium: 5.2 mmol/L (ref 3.5–5.2)
Sodium: 141 mmol/L (ref 134–144)
Total Protein: 7.4 g/dL (ref 6.0–8.5)

## 2016-05-28 ENCOUNTER — Other Ambulatory Visit: Payer: Self-pay | Admitting: Family Medicine

## 2016-05-28 DIAGNOSIS — M542 Cervicalgia: Secondary | ICD-10-CM

## 2016-07-05 ENCOUNTER — Encounter: Payer: Self-pay | Admitting: Family Medicine

## 2016-07-05 ENCOUNTER — Ambulatory Visit (INDEPENDENT_AMBULATORY_CARE_PROVIDER_SITE_OTHER): Payer: Managed Care, Other (non HMO) | Admitting: Family Medicine

## 2016-07-05 VITALS — BP 109/69 | HR 85 | Temp 97.0°F | Ht 74.0 in | Wt 208.4 lb

## 2016-07-05 DIAGNOSIS — F4320 Adjustment disorder, unspecified: Secondary | ICD-10-CM

## 2016-07-05 DIAGNOSIS — F4321 Adjustment disorder with depressed mood: Secondary | ICD-10-CM

## 2016-07-05 NOTE — Progress Notes (Signed)
   HPI  Patient presents today here for same-day visit.  Patient explains that he needs a couple of days off work because he found out his wife was cheating on him last night. He needs time to take care of his affairs at home. He's acutely anxious, however he is not want take any medications to manage the anxiety.  He has 2 kids, ages 426 and 6614.  He is understandably upset and understands that he will slowly heal.   PMH: Smoking status noted ROS: Per HPI  Objective: BP 109/69   Pulse 85   Temp 97 F (36.1 C) (Oral)   Ht 6\' 2"  (1.88 m)   Wt 208 lb 6.4 oz (94.5 kg)   BMI 26.76 kg/m  Gen: NAD, alert, cooperative with exam, tearful HEENT: NCAT Ext: No edema, warm Neuro: Alert and oriented, No gross deficits  Assessment and plan:  # Grief reaction Patient understandably upset, TURP 1 exam Does not want medications to support his transition. I have written a note that has asked use him for 3 days out of work. I offered any help that we can provide, welcomed him to come back anytime he needs us.   Murtis SinkSam Chanele Douglas, MD [facility] 07/05/2016, 8:53 AM

## 2017-06-12 ENCOUNTER — Encounter: Payer: Self-pay | Admitting: Family Medicine

## 2017-06-12 ENCOUNTER — Ambulatory Visit (INDEPENDENT_AMBULATORY_CARE_PROVIDER_SITE_OTHER): Payer: 59 | Admitting: Family Medicine

## 2017-06-12 VITALS — BP 125/75 | HR 73 | Temp 97.4°F | Ht 74.0 in | Wt 210.8 lb

## 2017-06-12 DIAGNOSIS — R5383 Other fatigue: Secondary | ICD-10-CM

## 2017-06-12 DIAGNOSIS — Z0000 Encounter for general adult medical examination without abnormal findings: Secondary | ICD-10-CM | POA: Diagnosis not present

## 2017-06-12 NOTE — Patient Instructions (Signed)
Great to see you!   Health Maintenance, Male A healthy lifestyle and preventive care is important for your health and wellness. Ask your health care provider about what schedule of regular examinations is right for you. What should I know about weight and diet? Eat a Healthy Diet  Eat plenty of vegetables, fruits, whole grains, low-fat dairy products, and lean protein.  Do not eat a lot of foods high in solid fats, added sugars, or salt.  Maintain a Healthy Weight Regular exercise can help you achieve or maintain a healthy weight. You should:  Do at least 150 minutes of exercise each week. The exercise should increase your heart rate and make you sweat (moderate-intensity exercise).  Do strength-training exercises at least twice a week.  Watch Your Levels of Cholesterol and Blood Lipids  Have your blood tested for lipids and cholesterol every 5 years starting at 39 years of age. If you are at high risk for heart disease, you should start having your blood tested when you are 39 years old. You may need to have your cholesterol levels checked more often if: ? Your lipid or cholesterol levels are high. ? You are older than 39 years of age. ? You are at high risk for heart disease.  What should I know about cancer screening? Many types of cancers can be detected early and may often be prevented. Lung Cancer  You should be screened every year for lung cancer if: ? You are a current smoker who has smoked for at least 30 years. ? You are a former smoker who has quit within the past 15 years.  Talk to your health care provider about your screening options, when you should start screening, and how often you should be screened.  Colorectal Cancer  Routine colorectal cancer screening usually begins at 39 years of age and should be repeated every 5-10 years until you are 39 years old. You may need to be screened more often if early forms of precancerous polyps or small growths are found.  Your health care provider may recommend screening at an earlier age if you have risk factors for colon cancer.  Your health care provider may recommend using home test kits to check for hidden blood in the stool.  A small camera at the end of a tube can be used to examine your colon (sigmoidoscopy or colonoscopy). This checks for the earliest forms of colorectal cancer.  Prostate and Testicular Cancer  Depending on your age and overall health, your health care provider may do certain tests to screen for prostate and testicular cancer.  Talk to your health care provider about any symptoms or concerns you have about testicular or prostate cancer.  Skin Cancer  Check your skin from head to toe regularly.  Tell your health care provider about any new moles or changes in moles, especially if: ? There is a change in a mole's size, shape, or color. ? You have a mole that is larger than a pencil eraser.  Always use sunscreen. Apply sunscreen liberally and repeat throughout the day.  Protect yourself by wearing long sleeves, pants, a wide-brimmed hat, and sunglasses when outside.  What should I know about heart disease, diabetes, and high blood pressure?  If you are 18-39 years of age, have your blood pressure checked every 3-5 years. If you are 40 years of age or older, have your blood pressure checked every year. You should have your blood pressure measured twice-once when you are at a   hospital or clinic, and once when you are not at a hospital or clinic. Record the average of the two measurements. To check your blood pressure when you are not at a hospital or clinic, you can use: ? An automated blood pressure machine at a pharmacy. ? A home blood pressure monitor.  Talk to your health care provider about your target blood pressure.  If you are between 45-79 years old, ask your health care provider if you should take aspirin to prevent heart disease.  Have regular diabetes screenings by  checking your fasting blood sugar level. ? If you are at a normal weight and have a low risk for diabetes, have this test once every three years after the age of 45. ? If you are overweight and have a high risk for diabetes, consider being tested at a younger age or more often.  A one-time screening for abdominal aortic aneurysm (AAA) by ultrasound is recommended for men aged 65-75 years who are current or former smokers. What should I know about preventing infection? Hepatitis B If you have a higher risk for hepatitis B, you should be screened for this virus. Talk with your health care provider to find out if you are at risk for hepatitis B infection. Hepatitis C Blood testing is recommended for:  Everyone born from 1945 through 1965.  Anyone with known risk factors for hepatitis C.  Sexually Transmitted Diseases (STDs)  You should be screened each year for STDs including gonorrhea and chlamydia if: ? You are sexually active and are younger than 39 years of age. ? You are older than 39 years of age and your health care provider tells you that you are at risk for this type of infection. ? Your sexual activity has changed since you were last screened and you are at an increased risk for chlamydia or gonorrhea. Ask your health care provider if you are at risk.  Talk with your health care provider about whether you are at high risk of being infected with HIV. Your health care provider may recommend a prescription medicine to help prevent HIV infection.  What else can I do?  Schedule regular health, dental, and eye exams.  Stay current with your vaccines (immunizations).  Do not use any tobacco products, such as cigarettes, chewing tobacco, and e-cigarettes. If you need help quitting, ask your health care provider.  Limit alcohol intake to no more than 2 drinks per day. One drink equals 12 ounces of beer, 5 ounces of wine, or 1 ounces of hard liquor.  Do not use street drugs.  Do not  share needles.  Ask your health care provider for help if you need support or information about quitting drugs.  Tell your health care provider if you often feel depressed.  Tell your health care provider if you have ever been abused or do not feel safe at home. This information is not intended to replace advice given to you by your health care provider. Make sure you discuss any questions you have with your health care provider. Document Released: 03/09/2008 Document Revised: 05/10/2016 Document Reviewed: 06/15/2015 Elsevier Interactive Patient Education  2018 [employer]vier Inc.  

## 2017-06-12 NOTE — Progress Notes (Signed)
   HPI  Patient presents today here for physical exam.   Patient feels well overall, however has had some fatigue for the last few years that seems to be slowly worsening. His sex drive is moderate. He is concerned about his testosterone.  He reports a good diet, he is watching his diet. He is occupationally active and exercises on a daily basis. His normal routine start at 4:45 AM and goes to bed between 9:00 and 11:30 at night.  He has had a divorce from his wife over the last year and now has his child 50% of the time. He seems to be adjusting well for this understandably stressful situation  PMH: Smoking status noted ROS: Per HPI  Objective: BP 125/75   Pulse 73   Temp (!) 97.4 F (36.3 C) (Oral)   Ht _0  (1.88 m)   Wt 210 lb 12.8 oz (95.6 kg)   BMI 27.07 kg/m  Gen: NAD, alert, cooperative with exam HEENT: NCAT, EOMI, PERRL CV: RRR, good S1/S2, no murmur Resp: CTABL, no wheezes, non-labored Abd: SNTND, BS present, no guarding or organomegaly Ext: No edema, warm Neuro: Alert and oriented, No gross deficits  Assessment and plan:  # Annual physical exam Normal exam except for slight overweight, he is actually just muscular, his BMI is not reflective of his health status He has healthy lifestyle choices Basic labs today, fasting  # Fatigue Patient with fatigue, we discussed depression, which he feels is unlikely We discussed his sleep habits, he is a bit restless and is likely not getting enough sleep, however he's satisfied with what is getting. Labs including testosterone    Orders Placed This Encounter  Procedures  . CMP14+EGFR  . CBC with Differential/Platelet  . Lipid panel  . TSH  . [place], MD [facility] 06/12/2017, 2:48 PM

## 2017-06-13 LAB — CBC WITH DIFFERENTIAL/PLATELET
BASOS: 0 %
Basophils Absolute: 0 10*3/uL (ref 0.0–0.2)
EOS (ABSOLUTE): 0.1 10*3/uL (ref 0.0–0.4)
EOS: 1 %
HEMATOCRIT: 48.5 % (ref 37.5–51.0)
Hemoglobin: 16.9 g/dL (ref 13.0–17.7)
IMMATURE GRANULOCYTES: 0 %
Immature Grans (Abs): 0 10*3/uL (ref 0.0–0.1)
LYMPHS: 26 %
Lymphocytes Absolute: 1.9 10*3/uL (ref 0.7–3.1)
MCH: 29.6 pg (ref 26.6–33.0)
MCHC: 34.8 g/dL (ref 31.5–35.7)
MCV: 85 fL (ref 79–97)
MONOCYTES: 7 %
Monocytes Absolute: 0.5 10*3/uL (ref 0.1–0.9)
NEUTROS PCT: 66 %
Neutrophils Absolute: 4.7 10*3/uL (ref 1.4–7.0)
PLATELETS: 187 10*3/uL (ref 150–379)
RBC: 5.7 x10E6/uL (ref 4.14–5.80)
RDW: 14.6 % (ref 12.3–15.4)
WBC: 7.2 10*3/uL (ref 3.4–10.8)

## 2017-06-13 LAB — CMP14+EGFR
ALT: 40 IU/L (ref 0–44)
AST: 34 IU/L (ref 0–40)
Albumin/Globulin Ratio: 1.7 (ref 1.2–2.2)
Albumin: 4.5 g/dL (ref 3.5–5.5)
Alkaline Phosphatase: 58 IU/L (ref 39–117)
BUN/Creatinine Ratio: 9 (ref 9–20)
BUN: 13 mg/dL (ref 6–20)
Bilirubin Total: 1 mg/dL (ref 0.0–1.2)
CALCIUM: 9.5 mg/dL (ref 8.7–10.2)
CO2: 24 mmol/L (ref 20–29)
CREATININE: 1.37 mg/dL — AB (ref 0.76–1.27)
Chloride: 101 mmol/L (ref 96–106)
GFR calc Af Amer: 75 mL/min/{1.73_m2} (ref >59)
GFR, EST NON AFRICAN AMERICAN: 65 mL/min/{1.73_m2} (ref >59)
GLOBULIN, TOTAL: 2.7 g/dL (ref 1.5–4.5)
Glucose: 77 mg/dL (ref 65–99)
Potassium: 4.7 mmol/L (ref 3.5–5.2)
Sodium: 140 mmol/L (ref 134–144)
Total Protein: 7.2 g/dL (ref 6.0–8.5)

## 2017-06-13 LAB — LIPID PANEL
CHOL/HDL RATIO: 2.7 ratio (ref 0.0–5.0)
Cholesterol, Total: 147 mg/dL (ref 100–199)
HDL: 54 mg/dL (ref >39)
LDL Calculated: 81 mg/dL (ref 0–99)
TRIGLYCERIDES: 62 mg/dL (ref 0–149)
VLDL Cholesterol Cal: 12 mg/dL (ref 5–40)

## 2017-06-13 LAB — TESTOSTERONE: Testosterone: 360 ng/dL (ref 264–916)

## 2017-06-13 LAB — TSH: TSH: 2.6 u[IU]/mL (ref 0.450–4.500)

## 2017-09-05 ENCOUNTER — Encounter: Payer: Self-pay | Admitting: Family Medicine

## 2017-09-05 ENCOUNTER — Ambulatory Visit (INDEPENDENT_AMBULATORY_CARE_PROVIDER_SITE_OTHER): Payer: 59 | Admitting: Family Medicine

## 2017-09-05 VITALS — BP 114/72 | HR 68 | Temp 98.1°F | Ht 74.0 in | Wt 212.0 lb

## 2017-09-05 DIAGNOSIS — M545 Low back pain, unspecified: Secondary | ICD-10-CM

## 2017-09-05 MED ORDER — METHYLPREDNISOLONE ACETATE 80 MG/ML IJ SUSP
80.0000 mg | Freq: Once | INTRAMUSCULAR | Status: AC
  Administered 2017-09-05: 80 mg via INTRAMUSCULAR

## 2017-09-05 MED ORDER — TIZANIDINE HCL 2 MG PO CAPS
2.0000 mg | ORAL_CAPSULE | Freq: Three times a day (TID) | ORAL | 0 refills | Status: DC | PRN
Start: 2017-09-05 — End: 2017-11-29

## 2017-09-05 MED ORDER — PREDNISONE 10 MG PO TABS: ORAL_TABLET | ORAL | 0 refills | Status: DC

## 2017-09-05 NOTE — Addendum Note (Signed)
Addended byDory Peru: RINTELMANN, GINA C on: 09/05/2017 02:54 PM   Modules accepted: Orders

## 2017-09-05 NOTE — Patient Instructions (Signed)
You may have had a small injury to the disc in your low back.  The symptoms that you describe are consistent with this.  As we discussed, these often will resolve with anti-inflammatories and time.  If you find no improvement within the next 7-10 days, next step would be referral to physical therapy.  Again, if symptoms were to persist we would send you to the orthopedics for further evaluation and management.  You were given a shot of high-dose steroid here in office.  You will start the prednisone tomorrow.  Make sure that you take this when you wake up each morning, as it can cause insomnia.  If you develop any worrisome symptoms or signs like weakness, numbness and tingling in the lower extremities or groin, fecal incontinence or urinary retention, please seek immediate medical attention in the emergency department.   Back Pain, Adult Back pain is very common in adults.The cause of back pain is rarely dangerous and the pain often gets better over time.The cause of your back pain may not be known. Some common causes of back pain include:  Strain of the muscles or ligaments supporting the spine.  Wear and tear (degeneration) of the spinal disks.  Arthritis.  Direct injury to the back.  For many people, back pain may return. Since back pain is rarely dangerous, most people can learn to manage this condition on their own. Follow these instructions at home: Watch your back pain for any changes. The following actions may help to lessen any discomfort you are feeling:  Remain active. It is stressful on your back to sit or stand in one place for long periods of time. Do not sit, drive, or stand in one place for more than 30 minutes at a time. Take short walks on even surfaces as soon as you are able.Try to increase the length of time you walk each day.  Exercise regularly as directed by your health care provider. Exercise helps your back heal faster. It also helps avoid future injury by  keeping your muscles strong and flexible.  Do not stay in bed.Resting more than 1-2 days can delay your recovery.  Pay attention to your body when you bend and lift. The most comfortable positions are those that put less stress on your recovering back. Always use proper lifting techniques, including: ? Bending your knees. ? Keeping the load close to your body. ? Avoiding twisting.  Find a comfortable position to sleep. Use a firm mattress and lie on your side with your knees slightly bent. If you lie on your back, put a pillow under your knees.  Avoid feeling anxious or stressed.Stress increases muscle tension and can worsen back pain.It is important to recognize when you are anxious or stressed and learn ways to manage it, such as with exercise.  Take medicines only as directed by your health care provider. Over-the-counter medicines to reduce pain and inflammation are often the most helpful.Your health care provider may prescribe muscle relaxant drugs.These medicines help dull your pain so you can more quickly return to your normal activities and healthy exercise.  Apply ice to the injured area: ? Put ice in a plastic bag. ? Place a towel between your skin and the bag. ? Leave the ice on for 20 minutes, 2-3 times a day for the first 2-3 days. After that, ice and heat may be alternated to reduce pain and spasms.  Maintain a healthy weight. Excess weight puts extra stress on your back and makes it  difficult to maintain good posture.  Contact a health care provider if:  You have pain that is not relieved with rest or medicine.  You have increasing pain going down into the legs or buttocks.  You have pain that does not improve in one week.  You have night pain.  You lose weight.  You have a fever or chills. Get help right away if:  You develop new bowel or bladder control problems.  You have unusual weakness or numbness in your arms or legs.  You develop nausea or  vomiting.  You develop abdominal pain.  You feel faint. This information is not intended to replace advice given to you by your health care provider. Make sure you discuss any questions you have with your health care provider. Document Released: 09/11/2005 Document Revised: 01/20/2016 Document Reviewed: 01/13/2014 Elsevier Interactive Patient Education  2017 [employer]

## 2017-09-05 NOTE — Progress Notes (Signed)
Subjective: CC: back pain PCP: Elenora GammaBradshaw, Samuel L, MD HPI: Patient is a 41 y.o. male presenting to clinic today for back pain. Concerns today include:  1. Back Pain Patient reports that pain began yesterday afternoon.  He reports a h/o back pain but notes that it is always been intermittent and never this bad.  Pain is a 9/10.  It does not radiate.  Extending the spine/standing worsens pain.  Sitting/lying improves pain.  Patient has been taking nothing orally for pain but has been using ice with little relief.  Patient denies trauma or injury.  He reports that he was bending over to move something with a golf club when he suddenly had sharp pain in his left low back.  Prior to this he had been playing in the snow with his children.  Denies dysuria, hematuria, fevers, chills, nausea, vomiting, abdominal pain, renal stones.   Denies saddle anesthesia, urinary retention/incontinence, bowel incontinence, weakness, falls, sensation changes or pain anywhere else. PMH negative for chronic back pain.  No h/o back surgeries.  He is a Production assistant, radiowelder/supervisor.   History Reviewed: nonsmoker.  ROS: per HPI  Objective: Office vital signs reviewed. BP 114/72   Pulse 68   Temp 98.1 F (36.7 C) (Oral)   Ht 6\' 2"  (1.88 m)   Wt 212 lb (96.2 kg)   BMI 27.22 kg/m   Physical Examination:  General: Awake, alert, well nourished, fit male, NAD Cardio: Regular rate, +2 DP Pulm: Normal work of breathing on room air Extremities: Warm, well-perfused. No edema, cyanosis or clubbing; +2 pulses bilaterally MSK: Antalgic gait and station  Lumbar Spine: Has full AROM but does have pain with extension of the lumbar spine.  No midline tenderness to palpation, no paraspinal tenderness to palpation.  There is a palpable paraspinal knot near the L5 level on the left.  No palpable bony deformities, slight reproduction of pain on the left with straight leg test on the left. Neuro: 4/5 lower extremity strength on the left  w/ hip flexion; 5/5 LE strength on the right; lower extremity light touch sensation grossly intact  Assessment/ Plan: 41 y.o. male    1. Acute left-sided low back pain without sciatica Physical findings significant for 4/5 strength with left hip flexion compared to the right.  He also had a palpable knot near the L5 paraspinal muscles on the left.  Patient had pain with extension of the lumbar spine.  No focal neurologic deficits appreciated.  I suspect that he may be experiencing lumbar strain versus possible herniated disc.  We discussed the options including physical therapy and referral to orthopedics.  We will treat conservatively with steroid pack and muscle relaxer.  He was given a dose of Depo-Medrol 80 mg IM here in office.  I have excused him from work through Monday.  If his pain persists, patient is amenable to going to physical therapy.  We discussed that surgical intervention usually does not occur without red flag symptoms or prolonged symptoms.  He voiced good understanding.  Strict return precautions and reasons for emergent evaluation in the emergency department review with patient.  He voiced understanding and will follow-up as needed.  Meds ordered this encounter  Medications  . predniSONE (DELTASONE) 10 MG tablet    Sig: Take 60mg  by mouth day 1, 50mg  day 2, 40mg  day 3, 30mg  day 4, 20mg  day 5, 10mg  day 6.  Then stop.    Dispense:  21 tablet    Refill:  0  .  tizanidine (ZANAFLEX) 2 MG capsule    Sig: Take 1 capsule (2 mg total) by mouth 3 (three) times daily as needed for muscle spasms.    Dispense:  20 capsule    Refill:  0    Debby Clyne Hulen SkainsM Zoe Creasman, DO [facility]

## 2017-09-21 NOTE — Progress Notes (Deleted)
    Subjective: CC: back pain HPI: Patient is a 41 y.o. male presenting to clinic today for back pain. Concerns today include:  1. Back Pain Patient seen 09/05/2017 for left-sided back pain without sciatica.  During that visit, his exam was significant for slight decrease in strength on the left side compared to the right of the lower extremities.  Pain with extension of the lumbar spine was also noted.  He was treated with a dose of Depo-Medrol 80 IM and discharged home with Zanaflex, prednisone taper and home physical therapy exercises to perform.  Pain today is a ***/10.  It *** radiate.  *** worsens pain.  *** improves pain.  Patient has been taking *** for pain with *** relief.  Patient denies trauma or injury.  *** dysuria, hematuria, fevers, chills, nausea, vomiting, abdominal pain, renal stones.   *** saddle anesthesia, urinary retention/incontinence, bowel incontinence, weakness, falls, sensation changes or pain anywhere else. PMH *** for chronic back pain.  *** h/o back surgeries.   History Reviewed: *** smoker. Health Maintenance: ***  ROS: All other systems reviewed and are negative.  Objective: Office vital signs reviewed. There were no vitals taken for this visit.  Physical Examination:  General: Awake, alert, *** nourished, NAD Cardio: Regular rate and rhythm, S1S2 heard, no murmurs appreciated Pulm: Clear to auscultation bilaterally, no wheezes, rhonchi or rales Extremities: Warm, well-perfused. No edema, cyanosis or clubbing; +*** pulses bilaterally MSK: *** gait and station  *** Spine: *** AROM, ***midline tenderness to palpation, *** paraspinal tenderness to palpation.  No palpable bony deformities,  ***straight leg test Neuro: ***/5 lower extremity strength; lower extremity light touch sensation grossly intact, *** heel walk, Toe Walk and Tandem Walk  Assessment/ Plan: 41 y.o. male ***  ***   Sole Lengacher M Makahla Kiser, DO [facility]

## 2017-09-26 ENCOUNTER — Ambulatory Visit: Payer: 59 | Admitting: Family Medicine

## 2017-10-03 ENCOUNTER — Encounter: Payer: Self-pay | Admitting: Family Medicine
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
## 2017-11-14 ENCOUNTER — Telehealth: Payer: Self-pay | Admitting: Family Medicine

## 2017-11-14 ENCOUNTER — Encounter: Payer: Self-pay | Admitting: Family Medicine

## 2017-11-14 ENCOUNTER — Ambulatory Visit: Payer: 59 | Admitting: Family Medicine

## 2017-11-14 ENCOUNTER — Ambulatory Visit (INDEPENDENT_AMBULATORY_CARE_PROVIDER_SITE_OTHER): Payer: 59

## 2017-11-14 VITALS — BP 119/78 | HR 87 | Temp 97.8°F | Ht 74.0 in | Wt 216.0 lb

## 2017-11-14 DIAGNOSIS — M545 Low back pain, unspecified: Secondary | ICD-10-CM

## 2017-11-14 MED ORDER — PREDNISONE 10 MG PO TABS: ORAL_TABLET | ORAL | 0 refills | Status: DC

## 2017-11-14 MED ORDER — METHYLPREDNISOLONE ACETATE 80 MG/ML IJ SUSP
80.0000 mg | Freq: Once | INTRAMUSCULAR | Status: AC
  Administered 2017-11-14: 80 mg via INTRAMUSCULAR

## 2017-11-14 NOTE — Telephone Encounter (Signed)
Oh sure.  I thought he said he was on vacation. Almira CoasterGina, please get a note for him through Monday.

## 2017-11-14 NOTE — Patient Instructions (Signed)
[facility].  You got a shot of Depo-Medrol here in office today.  I have prescribed a prednisone Dosepak to start tomorrow morning.  If you do not hear from the orthopedics within the week, please call me.   Back Pain, Adult Back pain is very common. The pain often gets better over time. The cause of back pain is usually not dangerous. Most people can learn to manage their back pain on their own. Follow these instructions at home: Watch your back pain for any changes. The following actions may help to lessen any pain you are feeling:  Stay active. Start with short walks on flat ground if you can. Try to walk farther each day.  Exercise regularly as told by your doctor. Exercise helps your back heal faster. It also helps avoid future injury by keeping your muscles strong and flexible.  Do not sit, drive, or stand in one place for more than 30 minutes.  Do not stay in bed. Resting more than 1-2 days can slow down your recovery.  Be careful when you bend or lift an object. Use good form when lifting: ? Bend at your knees. ? Keep the object close to your body. ? Do not twist.  Sleep on a firm mattress. Lie on your side, and bend your knees. If you lie on your back, put a pillow under your knees.  Take medicines only as told by your doctor.  Put ice on the injured area. ? Put ice in a plastic bag. ? Place a towel between your skin and the bag. ? Leave the ice on for 20 minutes, 2-3 times a day for the first 2-3 days. After that, you can switch between ice and heat packs.  Avoid feeling anxious or stressed. Find good ways to deal with stress, such as exercise.  Maintain a healthy weight. Extra weight puts stress on your back.  Contact a doctor if:  You have pain that does not go away with rest or medicine.  You have worsening pain that goes down into your legs or buttocks.  You have pain that does not get better in one week.  You have pain at  night.  You lose weight.  You have a fever or chills. Get help right away if:  You cannot control when you poop (bowel movement) or pee (urinate).  Your arms or legs feel weak.  Your arms or legs lose feeling (numbness).  You feel sick to your stomach (nauseous) or throw up (vomit).  You have belly (abdominal) pain.  You feel like you may pass out (faint). This information is not intended to replace advice given to you by your health care provider. Make sure you discuss any questions you have with your health care provider. Document Released: 02/28/2008 Document Revised: 02/17/2016 Document Reviewed: 01/13/2014 Elsevier Interactive Patient Education  [employer]2018 [employer]

## 2017-11-14 NOTE — Telephone Encounter (Signed)
Please advise and send back to pools    

## 2017-11-14 NOTE — Telephone Encounter (Signed)
PT was seen today by Nadine CountsGottschalk and needs a note for working saying he doesn't go back until Monday (25) starting today he has been out

## 2017-11-14 NOTE — Telephone Encounter (Signed)
Work note faxed  Pt aware. 

## 2017-11-19 ENCOUNTER — Telehealth: Payer: Self-pay | Admitting: Family Medicine

## 2017-11-19 NOTE — Telephone Encounter (Signed)
Patient seen on 11/14/2017 for his back and now he has the flu. Can we write a work note for the next couple of days?

## 2017-11-19 NOTE — Telephone Encounter (Signed)
Since he was seen for a different diagnosis here and no one he has established the diagnosis of the flu for him he would need to be seen to get that note.  He actually saw Dr. Nadine CountsGottschalk is not me  Thanks, WS

## 2017-11-19 NOTE — Telephone Encounter (Signed)
appt made

## 2017-11-20 ENCOUNTER — Ambulatory Visit: Payer: 59 | Admitting: Family Medicine

## 2017-11-20 NOTE — Progress Notes (Deleted)
Subjective: CC: ? flu PCP: Raliegh IpGottschalk, Ashly M, DO ZOX:WRUEAVWUHPI:Vincent Durham is a 42 y.o. male presenting to clinic today for:  1. Flu like symptoms  Patient reports *** that started ***.  *** cough, hemoptysis, congestion, rhinorrhea, sinus pressure, headache, SOB, dizziness, rash, nausea, vomiting, diarrhea, fevers, chills, myalgia, sick contacts, recent travel.  Patient has used *** with *** relief of symptoms.  *** history of COPD or asthma.  *** tobacco use/ exposure.    ROS: Per HPI  No Known Allergies No past medical history on file.  Current Outpatient Medications:  .  predniSONE (DELTASONE) 10 MG tablet, Take 60mg  by mouth day 1, 50mg  day 2, 40mg  day 3, 30mg  day 4, 20mg  day 5, 10mg  day 6.  Then stop., Disp: 21 tablet, Rfl: 0 .  tizanidine (ZANAFLEX) 2 MG capsule, Take 1 capsule (2 mg total) by mouth 3 (three) times daily as needed for muscle spasms. (Patient not taking: Reported on 11/14/2017), Disp: 20 capsule, Rfl: 0 Social History   Socioeconomic History  . Marital status: Single    Spouse name: Not on file  . Number of children: Not on file  . Years of education: Not on file  . Highest education level: Not on file  Social Needs  . Financial resource strain: Not on file  . Food insecurity - worry: Not on file  . Food insecurity - inability: Not on file  . Transportation needs - medical: Not on file  . Transportation needs - non-medical: Not on file  Occupational History  . Not on file  Tobacco Use  . Smoking status: Never Smoker  . Smokeless tobacco: Never Used  Substance and Sexual Activity  . Alcohol use: No  . Drug use: No  . Sexual activity: Yes    Birth control/protection: None    Comment: married 4 years, still sexually active with her   Other Topics Concern  . Not on file  Social History Narrative  . Not on file   Family History  Problem Relation Age of Onset  . Multiple sclerosis Mother   . Thyroid disease Mother   . Thyroid disease Sister      Objective: Office vital signs reviewed. There were no vitals taken for this visit.  Physical Examination:  General: Awake, alert, *** nourished, No acute distress HEENT: Normal    Neck: No masses palpated. No lymphadenopathy    Ears: Tympanic membranes intact, normal light reflex, no erythema, no bulging    Eyes: PERRLA, extraocular membranes intact, sclera ***    Nose: nasal turbinates moist, *** nasal discharge    Throat: moist mucus membranes, no erythema, *** tonsillar exudate.  Airway is patent Cardio: regular rate and rhythm, S1S2 heard, no murmurs appreciated Pulm: clear to auscultation bilaterally, no wheezes, rhonchi or rales; normal work of breathing on room air GI: soft, non-tender, non-distended, bowel sounds present x4, no hepatomegaly, no splenomegaly, no masses GU: external vaginal tissue ***, cervix ***, *** punctate lesions on cervix appreciated, *** discharge from cervical os, *** bleeding, *** cervical motion tenderness, *** abdominal/ adnexal masses Extremities: warm, well perfused, No edema, cyanosis or clubbing; +*** pulses bilaterally MSK: *** gait and *** station Skin: dry; intact; no rashes or lesions Neuro: *** Strength and light touch sensation grossly intact, *** DTRs ***/4  Assessment/ Plan: 42 y.o. male   ***  No orders of the defined types were placed in this encounter.  No orders of the defined types were placed in this encounter.  Janora Norlander, DO [facility] [phone]

## 2017-11-21 ENCOUNTER — Encounter: Payer: Self-pay | Admitting: Family Medicine

## 2017-11-26 NOTE — Patient Instructions (Signed)
Vincent Durham  11/26/2017   Your procedure is scheduled on: 11/28/2017   Report to [facility] Main  Entrance  Report to admitting at   0945 AM   Remember: Do not eat food or drink liquids :After Midnight.     Take these medicines the morning of surgery with A SIP OF WATER: none                                 You may not have any metal on your body including hair pins and              piercings  Do not wear jewelry,  lotions, powders or perfumes, deodorant                         Men may shave face and neck.   Do not bring valuables to the hospital. [facility] IS NOT             RESPONSIBLE   FOR VALUABLES.  Contacts, dentures or bridgework may not be worn into surgery.  Leave suitcase in the car. After surgery it may be brought to your room.                       Please read over the following fact sheets you were given: _____________________________________________________________________             [facility] - Preparing for Surgery Before surgery, you can play an important role.  Because skin is not sterile, your skin needs to be as free of germs as possible.  You can reduce the number of germs on your skin by washing with CHG (chlorahexidine gluconate) soap before surgery.  CHG is an antiseptic cleaner which kills germs and bonds with the skin to continue killing germs even after washing. Please DO NOT use if you have an allergy to CHG or antibacterial soaps.  If your skin becomes reddened/irritated stop using the CHG and inform your nurse when you arrive at Short Stay. Do not shave (including legs and underarms) for at least 48 hours prior to the first CHG shower.  You may shave your face/neck. Please follow these instructions carefully:  1.  Shower with CHG Soap the night before surgery and the  morning of Surgery.  2.  If you choose to wash your hair, wash your hair first as usual with your  normal  shampoo.  3.  After you shampoo, rinse  your hair and body thoroughly to remove the  shampoo.                           4.  Use CHG as you would any other liquid soap.  You can apply chg directly  to the skin and wash                       Gently with a scrungie or clean washcloth.  5.  Apply the CHG Soap to your body ONLY FROM THE NECK DOWN.   Do not use on face/ open                           Wound or open sores. Avoid contact  with eyes, ears mouth and genitals (private parts).                       Wash face,  Genitals (private parts) with your normal soap.             6.  Wash thoroughly, paying special attention to the area where your surgery  will be performed.  7.  Thoroughly rinse your body with warm water from the neck down.  8.  DO NOT shower/wash with your normal soap after using and rinsing off  the CHG Soap.                9.  Pat yourself dry with a clean towel.            10.  Wear clean pajamas.            11.  Place clean sheets on your bed the night of your first shower and do not  sleep with pets. Day of Surgery : Do not apply any lotions/deodorants the morning of surgery.  Please wear clean clothes to the hospital/surgery center.  FAILURE TO FOLLOW THESE INSTRUCTIONS MAY RESULT IN THE CANCELLATION OF YOUR SURGERY PATIENT SIGNATURE_________________________________  NURSE SIGNATURE__________________________________  ________________________________________________________________________   Rogelia MireIncentive Spirometer  An incentive spirometer is a tool that can help keep your lungs clear and active. This tool measures how well you are filling your lungs with each breath. Taking long deep breaths may help reverse or decrease the chance of developing breathing (pulmonary) problems (especially infection) following:  A long period of time when you are unable to move or be active. BEFORE THE PROCEDURE   If the spirometer includes an indicator to show your best effort, your nurse or respiratory therapist will set it to a  desired goal.  If possible, sit up straight or lean slightly forward. Try not to slouch.  Hold the incentive spirometer in an upright position. INSTRUCTIONS FOR USE  1. Sit on the edge of your bed if possible, or sit up as far as you can in bed or on a chair. 2. Hold the incentive spirometer in an upright position. 3. Breathe out normally. 4. Place the mouthpiece in your mouth and seal your lips tightly around it. 5. Breathe in slowly and as deeply as possible, raising the piston or the ball toward the top of the column. 6. Hold your breath for 3-5 seconds or for as long as possible. Allow the piston or ball to fall to the bottom of the column. 7. Remove the mouthpiece from your mouth and breathe out normally. 8. Rest for a few seconds and repeat Steps 1 through 7 at least 10 times every 1-2 hours when you are awake. Take your time and take a few normal breaths between deep breaths. 9. The spirometer may include an indicator to show your best effort. Use the indicator as a goal to work toward during each repetition. 10. After each set of 10 deep breaths, practice coughing to be sure your lungs are clear. If you have an incision (the cut made at the time of surgery), support your incision when coughing by placing a pillow or rolled up towels firmly against it. Once you are able to get out of bed, walk around indoors and cough well. You may stop using the incentive spirometer when instructed by your caregiver.  RISKS AND COMPLICATIONS  Take your time so you do not get dizzy or light-headed.  If you  are in pain, you may need to take or ask for pain medication before doing incentive spirometry. It is harder to take a deep breath if you are having pain. AFTER USE  Rest and breathe slowly and easily.  It can be helpful to keep track of a log of your progress. Your caregiver can provide you with a simple table to help with this. If you are using the spirometer at home, follow these  instructions: SEEK MEDICAL CARE IF:   You are having difficultly using the spirometer.  You have trouble using the spirometer as often as instructed.  Your pain medication is not giving enough relief while using the spirometer.  You develop fever of 100.5 F (38.1 C) or higher. SEEK IMMEDIATE MEDICAL CARE IF:   You cough up bloody sputum that had not been present before.  You develop fever of 102 F (38.9 C) or greater.  You develop worsening pain at or near the incision site. MAKE SURE YOU:   Understand these instructions.  Will watch your condition.  Will get help right away if you are not doing well or get worse. Document Released: 01/22/2007 Document Revised: 12/04/2011 Document Reviewed: 03/25/2007 [facility] Patient Information 2014 [place], [place].   ________________________________________________________________________

## 2017-11-27 ENCOUNTER — Other Ambulatory Visit: Payer: Self-pay

## 2017-11-27 ENCOUNTER — Encounter (HOSPITAL_COMMUNITY)
Admission: RE | Admit: 2017-11-27 | Discharge: 2017-11-27 | Disposition: A | Discharge status: Home / Self Care | Payer: 59 | Source: Ambulatory Visit | Attending: Orthopedic Surgery | Admitting: Orthopedic Surgery

## 2017-11-27 ENCOUNTER — Ambulatory Visit (HOSPITAL_COMMUNITY)
Admission: RE | Admit: 2017-11-27 | Discharge: 2017-11-27 | Disposition: A | Discharge status: Home / Self Care | Payer: 59 | Source: Ambulatory Visit | Attending: Diagnostic Radiology | Admitting: Diagnostic Radiology

## 2017-11-27 ENCOUNTER — Encounter (HOSPITAL_COMMUNITY): Payer: Self-pay

## 2017-11-27 DIAGNOSIS — Z01818 Encounter for other preprocedural examination: Secondary | ICD-10-CM

## 2017-11-27 DIAGNOSIS — M545 Low back pain, unspecified: Secondary | ICD-10-CM

## 2017-11-27 LAB — URINALYSIS, ROUTINE W REFLEX MICROSCOPIC
Bilirubin Urine: NEGATIVE
Glucose, UA: NEGATIVE mg/dL
Hgb urine dipstick: NEGATIVE
Ketones, ur: NEGATIVE mg/dL
Leukocytes, UA: NEGATIVE
Nitrite: NEGATIVE
Protein, ur: NEGATIVE mg/dL
Specific Gravity, Urine: 1.002 — ABNORMAL LOW (ref 1.005–1.030)
pH: 7 (ref 5.0–8.0)

## 2017-11-27 LAB — CBC WITH DIFFERENTIAL/PLATELET
Basophils Absolute: 0.1 10*3/uL (ref 0.0–0.1)
Basophils Relative: 1 %
Eosinophils Absolute: 0.1 10*3/uL (ref 0.0–0.7)
Eosinophils Relative: 1 %
HCT: 53 % — ABNORMAL HIGH (ref 39.0–52.0)
Hemoglobin: 18.8 g/dL — ABNORMAL HIGH (ref 13.0–17.0)
Lymphocytes Relative: 17 %
Lymphs Abs: 1.5 10*3/uL (ref 0.7–4.0)
MCH: 31.8 pg (ref 26.0–34.0)
MCHC: 35.5 g/dL (ref 30.0–36.0)
MCV: 89.5 fL (ref 78.0–100.0)
Monocytes Absolute: 0.7 10*3/uL (ref 0.1–1.0)
Monocytes Relative: 8 %
Neutro Abs: 6.5 10*3/uL (ref 1.7–7.7)
Neutrophils Relative %: 73 %
Platelets: 299 10*3/uL (ref 150–400)
RBC: 5.92 MIL/uL — ABNORMAL HIGH (ref 4.22–5.81)
RDW: 12.4 % (ref 11.5–15.5)
WBC: 8.8 10*3/uL (ref 4.0–10.5)

## 2017-11-27 LAB — COMPREHENSIVE METABOLIC PANEL
ALT: 45 U/L (ref 17–63)
AST: 33 U/L (ref 15–41)
Albumin: 4 g/dL (ref 3.5–5.0)
Alkaline Phosphatase: 70 U/L (ref 38–126)
Anion gap: 10 (ref 5–15)
BUN: 17 mg/dL (ref 6–20)
CO2: 28 mmol/L (ref 22–32)
Calcium: 9.3 mg/dL (ref 8.9–10.3)
Chloride: 101 mmol/L (ref 101–111)
Creatinine, Ser: 1.12 mg/dL (ref 0.61–1.24)
GFR calc Af Amer: >60 mL/min (ref >60)
GFR calc non Af Amer: >60 mL/min (ref >60)
Glucose, Bld: 72 mg/dL (ref 65–99)
Potassium: 4.6 mmol/L (ref 3.5–5.1)
Sodium: 139 mmol/L (ref 135–145)
Total Bilirubin: 0.8 mg/dL (ref 0.3–1.2)
Total Protein: 7.6 g/dL (ref 6.5–8.1)

## 2017-11-27 LAB — SURGICAL PCR SCREEN
MRSA, PCR: NEGATIVE
Staphylococcus aureus: NEGATIVE

## 2017-11-27 LAB — PROTIME-INR
INR: 1.04
Prothrombin Time: 13.5 seconds (ref 11.4–15.2)

## 2017-11-27 LAB — APTT: aPTT: 33 seconds (ref 24–36)

## 2017-11-27 MED ORDER — BUPIVACAINE LIPOSOME 1.3 % IJ SUSP
20.0000 mL | Freq: Once | INTRAMUSCULAR | Status: AC
  Administered 2017-11-28: 20 mL
  Filled 2017-11-27: qty 20

## 2017-11-28 ENCOUNTER — Observation Stay (HOSPITAL_COMMUNITY)
Admission: RE | Admit: 2017-11-28 | Discharge: 2017-11-29 | Disposition: A | Discharge status: Home / Self Care | Payer: 59 | Source: Ambulatory Visit | Attending: Orthopedic Surgery | Admitting: Orthopedic Surgery

## 2017-11-28 ENCOUNTER — Encounter (HOSPITAL_COMMUNITY)
Admission: RE | Disposition: A | Discharge status: Home / Self Care | Payer: Self-pay | Source: Ambulatory Visit | Attending: Orthopedic Surgery

## 2017-11-28 ENCOUNTER — Ambulatory Visit (HOSPITAL_COMMUNITY): Payer: 59

## 2017-11-28 ENCOUNTER — Encounter (HOSPITAL_COMMUNITY): Payer: Self-pay | Admitting: Registered Nurse

## 2017-11-28 ENCOUNTER — Other Ambulatory Visit: Payer: Self-pay

## 2017-11-28 ENCOUNTER — Ambulatory Visit (HOSPITAL_COMMUNITY): Payer: 59 | Admitting: Anesthesiology

## 2017-11-28 DIAGNOSIS — M21372 Foot drop, left foot: Secondary | ICD-10-CM | POA: Diagnosis not present

## 2017-11-28 DIAGNOSIS — M5126 Other intervertebral disc displacement, lumbar region: Secondary | ICD-10-CM | POA: Insufficient documentation

## 2017-11-28 DIAGNOSIS — M48061 Spinal stenosis, lumbar region without neurogenic claudication: Principal | ICD-10-CM | POA: Insufficient documentation

## 2017-11-28 DIAGNOSIS — Z820 Family history of epilepsy and other diseases of the nervous system: Secondary | ICD-10-CM | POA: Diagnosis not present

## 2017-11-28 DIAGNOSIS — Z419 Encounter for procedure for purposes other than remedying health state, unspecified: Secondary | ICD-10-CM

## 2017-11-28 HISTORY — DX: Other intervertebral disc displacement, lumbar region: M51.26

## 2017-11-28 HISTORY — PX: LUMBAR LAMINECTOMY/DECOMPRESSION MICRODISCECTOMY: SHX5026

## 2017-11-28 SURGERY — LUMBAR LAMINECTOMY/DECOMPRESSION MICRODISCECTOMY
Anesthesia: General | Laterality: Left

## 2017-11-28 MED ORDER — PHENOL 1.4 % MT LIQD: 1.0000 | OROMUCOSAL | Status: DC | PRN

## 2017-11-28 MED ORDER — BISACODYL 5 MG PO TBEC: 5.0000 mg | DELAYED_RELEASE_TABLET | Freq: Every day | ORAL | Status: DC | PRN

## 2017-11-28 MED ORDER — LACTATED RINGERS IV SOLN
INTRAVENOUS | Status: DC
  Administered 2017-11-28: 10:00:00 via INTRAVENOUS

## 2017-11-28 MED ORDER — PROMETHAZINE HCL 25 MG/ML IJ SOLN
6.2500 mg | INTRAMUSCULAR | Status: DC | PRN
  Administered 2017-11-28: 6.25 mg via INTRAVENOUS

## 2017-11-28 MED ORDER — ONDANSETRON HCL 4 MG/2ML IJ SOLN
INTRAMUSCULAR | Status: DC | PRN
  Administered 2017-11-28: 4 mg via INTRAVENOUS

## 2017-11-28 MED ORDER — LACTATED RINGERS IV SOLN
INTRAVENOUS | Status: DC
  Administered 2017-11-28: 18:00:00 via INTRAVENOUS

## 2017-11-28 MED ORDER — HYDROMORPHONE HCL 1 MG/ML IJ SOLN: 0.2500 mg | INTRAMUSCULAR | Status: DC | PRN

## 2017-11-28 MED ORDER — FENTANYL CITRATE (PF) 100 MCG/2ML IJ SOLN
INTRAMUSCULAR | Status: DC | PRN
  Administered 2017-11-28: 100 ug via INTRAVENOUS
  Administered 2017-11-28 (×2): 50 ug via INTRAVENOUS
  Administered 2017-11-28: 100 ug via INTRAVENOUS

## 2017-11-28 MED ORDER — MENTHOL 3 MG MT LOZG
1.0000 | LOZENGE | OROMUCOSAL | Status: DC | PRN
Start: 2017-11-28 — End: 2017-11-29

## 2017-11-28 MED ORDER — SODIUM CHLORIDE 0.9 % IV SOLN
INTRAVENOUS | Status: DC | PRN
  Administered 2017-11-28: 500 mL

## 2017-11-28 MED ORDER — PROPOFOL 10 MG/ML IV BOLUS
INTRAVENOUS | Status: DC | PRN
  Administered 2017-11-28: 200 mg via INTRAVENOUS

## 2017-11-28 MED ORDER — ONDANSETRON HCL 4 MG/2ML IJ SOLN
INTRAMUSCULAR | Status: AC
  Filled 2017-11-28: qty 2

## 2017-11-28 MED ORDER — EPHEDRINE 5 MG/ML INJ
INTRAVENOUS | Status: AC
Start: 2017-11-28 — End: ?
  Filled 2017-11-28: qty 10

## 2017-11-28 MED ORDER — PHENYLEPHRINE HCL 10 MG/ML IJ SOLN
INTRAMUSCULAR | Status: AC
  Filled 2017-11-28: qty 1

## 2017-11-28 MED ORDER — POLYMYXIN B SULFATE 500000 UNITS IJ SOLR
INTRAMUSCULAR | Status: AC
  Filled 2017-11-28: qty 500000

## 2017-11-28 MED ORDER — MIDAZOLAM HCL 5 MG/5ML IJ SOLN
INTRAMUSCULAR | Status: DC | PRN
  Administered 2017-11-28: 2 mg via INTRAVENOUS

## 2017-11-28 MED ORDER — ACETAMINOPHEN 650 MG RE SUPP: 650.0000 mg | RECTAL | Status: DC | PRN

## 2017-11-28 MED ORDER — LIDOCAINE HCL (CARDIAC) 20 MG/ML IV SOLN
INTRAVENOUS | Status: DC | PRN
  Administered 2017-11-28: 75 mg via INTRAVENOUS
  Administered 2017-11-28: 25 mg via INTRAVENOUS

## 2017-11-28 MED ORDER — METHOCARBAMOL 500 MG PO TABS
500.0000 mg | ORAL_TABLET | Freq: Four times a day (QID) | ORAL | Status: DC | PRN
  Administered 2017-11-29: 500 mg via ORAL
  Filled 2017-11-28: qty 1

## 2017-11-28 MED ORDER — SUGAMMADEX SODIUM 200 MG/2ML IV SOLN
INTRAVENOUS | Status: AC
  Filled 2017-11-28: qty 2

## 2017-11-28 MED ORDER — THROMBIN (RECOMBINANT) 5000 UNITS EX SOLR
CUTANEOUS | Status: AC
  Filled 2017-11-28: qty 10000

## 2017-11-28 MED ORDER — HYDROCODONE-ACETAMINOPHEN 10-325 MG PO TABS
1.0000 | ORAL_TABLET | ORAL | Status: DC | PRN
  Administered 2017-11-28 – 2017-11-29 (×4): 1 via ORAL
  Filled 2017-11-28 (×4): qty 1

## 2017-11-28 MED ORDER — MIDAZOLAM HCL 2 MG/2ML IJ SOLN
INTRAMUSCULAR | Status: AC
  Filled 2017-11-28: qty 2

## 2017-11-28 MED ORDER — PROMETHAZINE HCL 25 MG/ML IJ SOLN: 6.2500 mg | INTRAMUSCULAR | Status: DC | PRN

## 2017-11-28 MED ORDER — HYDROMORPHONE HCL 1 MG/ML IJ SOLN: 0.5000 mg | INTRAMUSCULAR | Status: DC | PRN

## 2017-11-28 MED ORDER — CEFAZOLIN SODIUM-DEXTROSE 1-4 GM/50ML-% IV SOLN
1.0000 g | Freq: Three times a day (TID) | INTRAVENOUS | Status: DC
  Administered 2017-11-28 – 2017-11-29 (×2): 1 g via INTRAVENOUS
  Filled 2017-11-28: qty 50

## 2017-11-28 MED ORDER — DEXAMETHASONE SODIUM PHOSPHATE 10 MG/ML IJ SOLN
INTRAMUSCULAR | Status: AC
  Filled 2017-11-28: qty 1

## 2017-11-28 MED ORDER — FLEET ENEMA 7-19 GM/118ML RE ENEM: 1.0000 | ENEMA | Freq: Once | RECTAL | Status: DC | PRN

## 2017-11-28 MED ORDER — ACETAMINOPHEN 325 MG PO TABS: 650.0000 mg | ORAL_TABLET | ORAL | Status: DC | PRN

## 2017-11-28 MED ORDER — ONDANSETRON HCL 4 MG/2ML IJ SOLN
4.0000 mg | Freq: Four times a day (QID) | INTRAMUSCULAR | Status: DC | PRN
  Administered 2017-11-28: 4 mg via INTRAVENOUS
  Filled 2017-11-28: qty 2

## 2017-11-28 MED ORDER — ROCURONIUM BROMIDE 100 MG/10ML IV SOLN
INTRAVENOUS | Status: DC | PRN
  Administered 2017-11-28: 60 mg via INTRAVENOUS

## 2017-11-28 MED ORDER — CHLORHEXIDINE GLUCONATE 4 % EX LIQD: 60.0000 mL | Freq: Once | CUTANEOUS | Status: DC

## 2017-11-28 MED ORDER — FENTANYL CITRATE (PF) 250 MCG/5ML IJ SOLN
INTRAMUSCULAR | Status: AC
  Filled 2017-11-28: qty 5

## 2017-11-28 MED ORDER — BUPIVACAINE-EPINEPHRINE 0.5% -1:200000 IJ SOLN
INTRAMUSCULAR | Status: DC | PRN
  Administered 2017-11-28: 20 mL

## 2017-11-28 MED ORDER — DEXAMETHASONE SODIUM PHOSPHATE 10 MG/ML IJ SOLN
INTRAMUSCULAR | Status: DC | PRN
  Administered 2017-11-28: 10 mg via INTRAVENOUS

## 2017-11-28 MED ORDER — PROPOFOL 10 MG/ML IV BOLUS
INTRAVENOUS | Status: AC
  Filled 2017-11-28: qty 20

## 2017-11-28 MED ORDER — PROMETHAZINE HCL 25 MG/ML IJ SOLN
INTRAMUSCULAR | Status: AC
  Administered 2017-11-28: 6.25 mg via INTRAVENOUS
  Filled 2017-11-28: qty 1

## 2017-11-28 MED ORDER — BACITRACIN ZINC 500 UNIT/GM EX OINT
TOPICAL_OINTMENT | CUTANEOUS | Status: AC
  Filled 2017-11-28: qty 28.35

## 2017-11-28 MED ORDER — PHENYLEPHRINE 40 MCG/ML (10ML) SYRINGE FOR IV PUSH (FOR BLOOD PRESSURE SUPPORT)
PREFILLED_SYRINGE | INTRAVENOUS | Status: AC
  Filled 2017-11-28: qty 10

## 2017-11-28 MED ORDER — ONDANSETRON HCL 4 MG PO TABS
4.0000 mg | ORAL_TABLET | Freq: Four times a day (QID) | ORAL | Status: DC | PRN
  Administered 2017-11-29: 4 mg via ORAL
  Filled 2017-11-28: qty 1

## 2017-11-28 MED ORDER — OXYCODONE HCL 5 MG PO TABS: 5.0000 mg | ORAL_TABLET | Freq: Once | ORAL | Status: DC | PRN

## 2017-11-28 MED ORDER — METHOCARBAMOL 1000 MG/10ML IJ SOLN
500.0000 mg | Freq: Four times a day (QID) | INTRAVENOUS | Status: DC | PRN
  Administered 2017-11-28: 500 mg via INTRAVENOUS
  Filled 2017-11-28: qty 550

## 2017-11-28 MED ORDER — OXYCODONE HCL 5 MG/5ML PO SOLN
5.0000 mg | Freq: Once | ORAL | Status: DC | PRN
  Filled 2017-11-28: qty 5

## 2017-11-28 MED ORDER — POLYETHYLENE GLYCOL 3350 17 G PO PACK: 17.0000 g | PACK | Freq: Every day | ORAL | Status: DC | PRN

## 2017-11-28 MED ORDER — CEFAZOLIN SODIUM-DEXTROSE 2-4 GM/100ML-% IV SOLN
2.0000 g | INTRAVENOUS | Status: AC
  Administered 2017-11-28: 2 g via INTRAVENOUS
  Filled 2017-11-28: qty 100

## 2017-11-28 MED ORDER — SUCCINYLCHOLINE CHLORIDE 20 MG/ML IJ SOLN
INTRAMUSCULAR | Status: DC | PRN
  Administered 2017-11-28: 10 mg via INTRAVENOUS

## 2017-11-28 MED ORDER — BUPIVACAINE-EPINEPHRINE (PF) 0.5% -1:200000 IJ SOLN
INTRAMUSCULAR | Status: AC
  Filled 2017-11-28: qty 30

## 2017-11-28 MED ORDER — SUGAMMADEX SODIUM 200 MG/2ML IV SOLN
INTRAVENOUS | Status: DC | PRN
  Administered 2017-11-28: 200 mg via INTRAVENOUS

## 2017-11-28 SURGICAL SUPPLY — 50 items
AGENT HMST SPONGE THK3/8 (HEMOSTASIS) ×1
APL SKNCLS STERI-STRIP NONHPOA (GAUZE/BANDAGES/DRESSINGS) ×1
BAG SPEC THK2 15X12 ZIP CLS (MISCELLANEOUS)
BAG ZIPLOCK 12X15 (MISCELLANEOUS) IMPLANT
BENZOIN TINCTURE PRP APPL 2/3 (GAUZE/BANDAGES/DRESSINGS) ×3 IMPLANT
CLEANER TIP ELECTROSURG 2X2 (MISCELLANEOUS) ×3 IMPLANT
COVER SURGICAL LIGHT HANDLE (MISCELLANEOUS) ×3 IMPLANT
DRAIN PENROSE 18X1/4 LTX STRL (WOUND CARE) IMPLANT
DRAPE MICROSCOPE LEICA (MISCELLANEOUS) ×3 IMPLANT
DRAPE POUCH INSTRU U-SHP 10X18 (DRAPES) ×3 IMPLANT
DRAPE SHEET LG 3/4 BI-LAMINATE (DRAPES) ×3 IMPLANT
DRAPE SURG 17X11 SM STRL (DRAPES) ×3 IMPLANT
DRSG ADAPTIC 3X8 NADH LF (GAUZE/BANDAGES/DRESSINGS) ×3 IMPLANT
DRSG PAD ABDOMINAL 8X10 ST (GAUZE/BANDAGES/DRESSINGS) ×12 IMPLANT
DURAPREP 26ML APPLICATOR (WOUND CARE) ×3 IMPLANT
ELECT BLADE TIP CTD 4 INCH (ELECTRODE) ×3 IMPLANT
ELECT REM PT RETURN 15FT ADLT (MISCELLANEOUS) ×3 IMPLANT
GAUZE SPONGE 4X4 12PLY STRL (GAUZE/BANDAGES/DRESSINGS) ×3 IMPLANT
GLOVE BIOGEL PI IND STRL 6.5 (GLOVE) ×1 IMPLANT
GLOVE BIOGEL PI IND STRL 8.5 (GLOVE) ×1 IMPLANT
GLOVE BIOGEL PI INDICATOR 6.5 (GLOVE) ×2
GLOVE BIOGEL PI INDICATOR 8.5 (GLOVE) ×2
GLOVE ECLIPSE 8.0 STRL XLNG CF (GLOVE) ×6 IMPLANT
GLOVE SURG SS PI 6.5 STRL IVOR (GLOVE) ×3 IMPLANT
GOWN STRL REUS W/ TWL LRG LVL3 (GOWN DISPOSABLE) ×1 IMPLANT
GOWN STRL REUS W/TWL LRG LVL3 (GOWN DISPOSABLE) ×3
GOWN STRL REUS W/TWL XL LVL3 (GOWN DISPOSABLE) ×6 IMPLANT
HEMOSTAT SPONGE AVITENE ULTRA (HEMOSTASIS) ×3 IMPLANT
KIT BASIN OR (CUSTOM PROCEDURE TRAY) ×3 IMPLANT
KIT POSITIONING SURG ANDREWS (MISCELLANEOUS) ×3 IMPLANT
MANIFOLD NEPTUNE II (INSTRUMENTS) ×3 IMPLANT
MARKER SKIN DUAL TIP RULER LAB (MISCELLANEOUS) ×3 IMPLANT
NDL SPNL 18GX3.5 QUINCKE PK (NEEDLE) ×2 IMPLANT
NEEDLE HYPO 22GX1.5 SAFETY (NEEDLE) ×6 IMPLANT
NEEDLE SPNL 18GX3.5 QUINCKE PK (NEEDLE) ×6 IMPLANT
PACK LAMINECTOMY ORTHO (CUSTOM PROCEDURE TRAY) ×3 IMPLANT
PAD ABD 8X10 STRL (GAUZE/BANDAGES/DRESSINGS) ×8 IMPLANT
PATTIES SURGICAL .5 X.5 (GAUZE/BANDAGES/DRESSINGS) IMPLANT
PATTIES SURGICAL .75X.75 (GAUZE/BANDAGES/DRESSINGS) ×3 IMPLANT
PATTIES SURGICAL 1X1 (DISPOSABLE) IMPLANT
PIN SAFETY NICK PLATE  2 MED (MISCELLANEOUS)
PIN SAFETY NICK PLATE 2 MED (MISCELLANEOUS) IMPLANT
SPONGE LAP 4X18 X RAY DECT (DISPOSABLE) ×6 IMPLANT
STAPLER VISISTAT 35W (STAPLE) ×3 IMPLANT
SUT VIC AB 1 CT1 27 (SUTURE) ×6
SUT VIC AB 1 CT1 27XBRD ANTBC (SUTURE) ×2 IMPLANT
SUT VIC AB 2-0 CT1 27 (SUTURE) ×6
SUT VIC AB 2-0 CT1 TAPERPNT 27 (SUTURE) ×2 IMPLANT
SYR 20CC LL (SYRINGE) ×6 IMPLANT
TOWEL OR 17X26 10 PK STRL BLUE (TOWEL DISPOSABLE) ×3 IMPLANT

## 2017-11-28 NOTE — Anesthesia Postprocedure Evaluation (Signed)
Anesthesia Post Note  Patient: Gretta ArabJonathan C Durell  Procedure(s) Performed: Central decompression lumbar laminectomy L4-L5 left (Left )     Patient location during evaluation: PACU Anesthesia Type: General Level of consciousness: awake and alert Pain management: pain level controlled Vital Signs Assessment: post-procedure vital signs reviewed and stable Respiratory status: spontaneous breathing, nonlabored ventilation, respiratory function stable and patient connected to nasal cannula oxygen Cardiovascular status: blood pressure returned to baseline and stable Postop Assessment: no apparent nausea or vomiting Anesthetic complications: no    Last Vitals:  Vitals:   11/28/17 1400 11/28/17 1415  BP: 108/70 101/68  Pulse: (!) 53 (!) 57  Resp: 15 12  Temp:  36.4 C  SpO2: 100% 100%    Last Pain:  Vitals:   11/28/17 1415  TempSrc:   PainSc: 5                  Ryan P Ellender

## 2017-11-28 NOTE — Anesthesia Procedure Notes (Signed)
Procedure Name: Intubation Date/Time: 11/28/2017 11:24 AM Performed by: Lissa Morales, CRNA Pre-anesthesia Checklist: Patient identified, Emergency Drugs available, Suction available and Patient being monitored Patient Re-evaluated:Patient Re-evaluated prior to induction Oxygen Delivery Method: Circle system utilized Preoxygenation: Pre-oxygenation with 100% oxygen Induction Type: IV induction Ventilation: Mask ventilation without difficulty Laryngoscope Size: Mac and 4 Grade View: Grade I Tube type: Oral Tube size: 8.0 mm Number of attempts: 1 Airway Equipment and Method: Stylet and Oral airway Placement Confirmation: ETT inserted through vocal cords under direct vision,  positive ETCO2 and breath sounds checked- equal and bilateral Secured at: 21 cm Tube secured with: Tape Dental Injury: Teeth and Oropharynx as per pre-operative assessment

## 2017-11-28 NOTE — H&P (Signed)
Vincent Durham is an 42 y.o. male.   Chief Complaint: Severe Pain Left leg . HPI: Progressive pain in his Left Leg that resulted in weakness in his left foot Dorsiflexors.  History reviewed. No pertinent past medical history.  Past Surgical History:  Procedure Laterality Date  . KNEE SURGERY Right   . KNEE SURGERY Left   . left wrist surgery       Family History  Problem Relation Age of Onset  . Multiple sclerosis Mother   . Thyroid disease Mother   . Thyroid disease Sister    Social History:  reports that  has never smoked. he has never used smokeless tobacco. He reports that he does not drink alcohol or use drugs.  Allergies: No Known Allergies  Medications Prior to Admission  Medication Sig Dispense Refill  . acetaminophen (TYLENOL) 500 MG tablet Take 1,000 mg by mouth every 6 (six) hours as needed for moderate pain or headache.    . ibuprofen (ADVIL,MOTRIN) 200 MG tablet Take 600 mg by mouth every 6 (six) hours as needed for headache or moderate pain.    . tizanidine (ZANAFLEX) 2 MG capsule Take 1 capsule (2 mg total) by mouth 3 (three) times daily as needed for muscle spasms. (Patient not taking: Reported on 11/14/2017) 20 capsule 0    Results for orders placed or performed during the hospital encounter of 11/27/17 (from the past 48 hour(s))  APTT     Status: None   Collection Time: 11/27/17  1:17 PM  Result Value Ref Range   aPTT 33 24 - 36 seconds    Comment: Performed at [facility], [place] [address], [place], [place] [address]  CBC WITH DIFFERENTIAL     Status: Abnormal   Collection Time: 11/27/17  1:17 PM  Result Value Ref Range   WBC 8.8 4.0 - 10.5 K/uL   RBC 5.92 (H) 4.22 - 5.81 MIL/uL   Hemoglobin 18.8 (H) 13.0 - 17.0 g/dL   HCT 53.0 (H) 39.0 - 52.0 %   MCV 89.5 78.0 - 100.0 fL   MCH 31.8 26.0 - 34.0 pg   MCHC 35.5 30.0 - 36.0 g/dL   RDW 12.4 11.5 - 15.5 %   Platelets 299 150 - 400 K/uL   Neutrophils Relative % 73 %   Neutro Abs 6.5  1.7 - 7.7 K/uL   Lymphocytes Relative 17 %   Lymphs Abs 1.5 0.7 - 4.0 K/uL   Monocytes Relative 8 %   Monocytes Absolute 0.7 0.1 - 1.0 K/uL   Eosinophils Relative 1 %   Eosinophils Absolute 0.1 0.0 - 0.7 K/uL   Basophils Relative 1 %   Basophils Absolute 0.1 0.0 - 0.1 K/uL    Comment: Performed at [facility], [place] [address] [place], [place] [address]  Comprehensive metabolic panel     Status: None   Collection Time: 11/27/17  1:17 PM  Result Value Ref Range   Sodium 139 135 - 145 mmol/L   Potassium 4.6 3.5 - 5.1 mmol/L   Chloride 101 101 - 111 mmol/L   CO2 28 22 - 32 mmol/L   Glucose, Bld 72 65 - 99 mg/dL   BUN 17 6 - 20 mg/dL   Creatinine, Ser 1.12 0.61 - 1.24 mg/dL   Calcium 9.3 8.9 - 10.3 mg/dL   Total Protein 7.6 6.5 - 8.1 g/dL   Albumin 4.0 3.5 - 5.0 g/dL   AST 33 15 - 41 U/L   ALT 45  17 - 63 U/L   Alkaline Phosphatase 70 38 - 126 U/L   Total Bilirubin 0.8 0.3 - 1.2 mg/dL   GFR calc non Af Amer >60 >60 mL/min   GFR calc Af Amer >60 >60 mL/min    Comment: (NOTE) The eGFR has been calculated using the CKD EPI equation. This calculation has not been validated in all clinical situations. eGFR's persistently <60 mL/min signify possible Chronic Kidney Disease.    Anion gap 10 5 - 15    Comment: Performed at [facility], [place] [address] [place], [place] [address]  Protime-INR     Status: None   Collection Time: 11/27/17  1:17 PM  Result Value Ref Range   Prothrombin Time 13.5 11.4 - 15.2 seconds   INR 1.04     Comment: Performed at [facility], [place] [address], [place], [place] [address]  Urinalysis, Routine w reflex microscopic     Status: Abnormal   Collection Time: 11/27/17  1:19 PM  Result Value Ref Range   Color, Urine COLORLESS (A) YELLOW   APPearance CLEAR CLEAR   Specific Gravity, Urine 1.002 (L) 1.005 - 1.030   pH 7.0 5.0 - 8.0   Glucose, UA NEGATIVE NEGATIVE mg/dL   Hgb urine dipstick NEGATIVE  NEGATIVE   Bilirubin Urine NEGATIVE NEGATIVE   Ketones, ur NEGATIVE NEGATIVE mg/dL   Protein, ur NEGATIVE NEGATIVE mg/dL   Nitrite NEGATIVE NEGATIVE   Leukocytes, UA NEGATIVE NEGATIVE    Comment: Performed at [place] [address] [place], [place] [address]  Surgical pcr screen     Status: None   Collection Time: 11/27/17  1:19 PM  Result Value Ref Range   MRSA, PCR NEGATIVE NEGATIVE   Staphylococcus aureus NEGATIVE NEGATIVE    Comment: (NOTE) The Xpert SA Assay (FDA approved for NASAL specimens in patients 15 years of age and older), is one component of a comprehensive surveillance program. It is not intended to diagnose infection nor to guide or monitor treatment. Performed at [facility], [place] [address], [place], [place] [address]    Dg Chest 2 View  Result Date: 11/27/2017 CLINICAL DATA:  Pre-op respiratory exam for lumbar spine surgery. EXAM: CHEST - 2 VIEW COMPARISON:  None. FINDINGS: The heart size and mediastinal contours are within normal limits. Both lungs are clear. The visualized skeletal structures are unremarkable. IMPRESSION: Negative.  No active cardiopulmonary disease. Electronically Signed   By: Earle Gell M.D.   On: 11/27/2017 17:04   Dg Lumbar Spine 2-3 Views  Result Date: 11/27/2017 CLINICAL DATA:  Preop back surgery. EXAM: LUMBAR SPINE - 2-3 VIEW COMPARISON:  11/14/2017 FINDINGS: Five lumbar vertebra. These were labeled for preoperative planning purposes. Normal lumbar alignment. Negative for fracture or mass. No pars defect. SI joints intact. Minimal anterior spurring L3-4. Otherwise no significant disc space narrowing. IMPRESSION: Five lumbar vertebral segments.  No acute abnormality. Electronically Signed   By: Franchot Gallo M.D.   On: 11/27/2017 17:08    Review of Systems  Constitutional: Negative.   HENT: Negative.   Eyes: Negative.   Respiratory: Negative.   Cardiovascular: Negative.   Gastrointestinal:  Negative.   Genitourinary: Negative.   Musculoskeletal: Positive for back pain.  Skin: Negative.   Neurological: Positive for focal weakness.  Endo/Heme/Allergies: Negative.   Psychiatric/Behavioral: Negative.     Blood pressure 113/82, pulse 65, temperature 98.2 F (36.8 C), temperature source Oral, resp. rate 16, height 6' 2"  (1.88 m), weight  93 kg (205 lb), SpO2 100 %. Physical Exam  Constitutional: He appears well-developed.  HENT:  Head: Normocephalic.  Eyes: Pupils are equal, round, and reactive to light.  Neck: Normal range of motion.  Cardiovascular: Normal rate.  Respiratory: Effort normal.  GI: Soft.  Musculoskeletal: He exhibits tenderness.  Neurological:  Marked weakness of his Left Foot Dorsiflexors.  Skin: Skin is warm.  Psychiatric: He has a normal mood and affect.     Assessment/Plan Decompressive Lumbar Laminectomy and Microdiscectomy at L-4-L-5 on the left.  Latanya Maudlin, MD 11/28/2017, 10:59 AM

## 2017-11-28 NOTE — Interval H&P Note (Signed)
History and Physical Interval Note:  11/28/2017 11:04 AM  Vincent Durham  has presented today for surgery, with the diagnosis of Herniated nucelus pulposus left  The various methods of treatment have been discussed with the patient and family. After consideration of risks, benefits and other options for treatment, the patient has consented to  Procedure(s): Central decompression lumbar laminectomy L4-L5 left (N/A) as a surgical intervention .  The patient's history has been reviewed, patient examined, no change in status, stable for surgery.  I have reviewed the patient's chart and labs.  Questions were answered to the patient's satisfaction.     Ranee Gosselinonald Latacha Texeira

## 2017-11-28 NOTE — Brief Op Note (Signed)
11/28/2017  12:45 PM  PATIENT:  Vincent Durham  42 y.o. 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  POST-OPERATIVE DIAGNOSIS: Same as Pre-Op  PROCEDURE:  Decompressive Lumbar Laminectomy at L-4-L-5 for Spinal Stenosis and Microdiscectomy at L-4-L-5 on the Left.Foraminotomy for th L-5 Nerve root on the Left.  SURGEON:  Surgeon(s) and Role:    * Ranee GosselinGioffre, Adryana Mogensen, MD - Primary  PHYSICIAN ASSISTANT: Dimitri PedAmber Constable PA  ASSISTANTS: Dimitri PedAmber Constable PA  ANESTHESIA:   general  EBL:  50cc  BLOOD ADMINISTERED:none  DRAINS: none   LOCAL MEDICATIONS USED:  MARCAINE 20cc of 0.50% with Epinephrine at the start of the case.20cc of Exparel at the end of the case.     SPECIMEN:  Source of Specimen:  L-4-L-5 on the Left  DISPOSITION OF SPECIMEN:  PATHOLOGY  COUNTS:  YES  TOURNIQUET:  * No tourniquets in log *  DICTATION: .Other Dictation: Dictation Number [id number]  PLAN OF CARE: Admit for overnight observation  PATIENT DISPOSITION:  PACU - hemodynamically stable.   Delay start of Pharmacological VTE agent (>24hrs) due to surgical blood loss or risk of bleeding: yes

## 2017-11-28 NOTE — Transfer of Care (Signed)
Immediate Anesthesia Transfer of Care Note  Patient: Vincent Durham  Procedure(s) Performed: Central decompression lumbar laminectomy L4-L5 left (Left )  Patient Location: PACU  Anesthesia Type:General  Level of Consciousness: awake, alert , oriented and patient cooperative  Airway & Oxygen Therapy: Patient Spontanous Breathing and Patient connected to face mask oxygen  Post-op Assessment: Report given to RN, Post -op Vital signs reviewed and stable and Patient moving all extremities X 4  Post vital signs: stable  Last Vitals:  Vitals:   11/28/17 1004 11/28/17 1302  BP: 113/82 115/68  Pulse: 65 70  Resp: 16 14  Temp: 36.8 C (!) 36.3 C  SpO2: 100% 99%    Last Pain:  Vitals:   11/28/17 1302  TempSrc:   PainSc: Asleep         Complications: No apparent anesthesia complications

## 2017-11-28 NOTE — Discharge Instructions (Signed)
For the first three days, remove your dressing, and tape a piece of saran wrap over your incision °Take your shower, then remove the saran wrap and put a clean dressing on, then reapply your sling. °After three days you can shower without the saran wrap.  °No lifting or excessive bending °No driving while taking pain medications. °Call Dr. Gioffre if any wound complications or temperature of 101 degrees F or over.  °Call the office for an appointment to see Dr. Gioffre in two weeks: [phone] and ask for Dr. Gioffre's nurse, Tammy Johnson. °

## 2017-11-28 NOTE — Op Note (Signed)
NAMEDEONTA, BOMBERGER NO.:  [id number]  MEDICAL RECORD NO.:  [id number]  LOCATION:                                 FACILITY:  PHYSICIAN:  Georges Lynch. Jashawn Floyd, M.D.DATE OF BIRTH:  1976/07/04  DATE OF PROCEDURE:  11/28/2017 DATE OF DISCHARGE:                              OPERATIVE REPORT   SURGEON:  Georges Lynch. Darrelyn Hillock, M.D.  ASSISTANT:  Dimitri Ped, [place].  PREOPERATIVE DIAGNOSES: 1. Spinal stenosis at L4-L5. 2. Foraminal stenosis at L4-5 involving the L5 nerve root. 3. Large herniated disk at L4-5 on the left. 4. Partial footdrop on the left.  POSTOPERATIVE DIAGNOSES: 1. Spinal stenosis at L4-L5. 2. Foraminal stenosis at L4-5 involving the L5 nerve root. 3. Large herniated disk at L4-5 on the left. 4. Partial footdrop on the left.  OPERATION: 1. Central decompressive lumbar laminectomy at L4-5 for spinal     stenosis. 2. Foraminotomy for the L5 root on the left. 3. Microdiskectomy for an extremely large herniated disk at L4-5 on     the left.  DESCRIPTION OF PROCEDURE:  Under general anesthesia with the patient on a spinal frame, a routine orthopedic prep and draping of the back was carried out.  The appropriate time-out was carried out.  I also marked the left side of the back, even though, we were going central, I marked that in the holding area, at the side of the herniated disk and at the side of the partial footdrop.  Two needles were then were placed in the back for localization purposes.  X-ray was taken.  I then made an incision over the L4-5 interspace, extended that proximally and distally.  I then went down and stripped the muscle from the lamina and spinous process bilaterally.  Kocher clamp was placed on the spinous process.  X-ray was taken to verify the position.  We then inserted the [facility] retractors.  We cauterized all the lateral bleeders.  We then removed the spinous process of L4 and went down, did a decompression centrally of  L4-L5.  I preserved the facets bilaterally.  I then brought the microscope in.  I protected the dura with cottonoids and then removed the ligamentum flavum in usual fashion.  At this time, I went out laterally on that left recess and thoroughly decompressed it.  He was extremely tight.  I then identified the 5 nerve root, gently retracted it over.  I cauterized the lateral recess veins with a bipolar.  Once the root now was easily moved over, I identified the disk by putting a needle into the disk.  I then made a cruciate incision in the posterior longitudinal ligament.  I inserted the nerve hook and then the mini pituitary rongeurs and then removed a large fragment about 2 cm in length in one piece.  I then probed this area thoroughly, I went down distally and with a nerve hook and the Epstein curettes, I went into the disk space.  There was no further herniation.  The dura now was free. The nerve root was free.  I was able to take the hockey-stick and go out into the foramina after we did a foraminotomy and it was free,  so was the foramina above.  Thoroughly, irrigated out the area, loosely applied some Gelfoam, closed the wound layers in usual fashion except I left a small distal deep and proximal deep part of the wound open for drainage purposes.  Remaining part of the wound was closed as I mentioned in usual fashion.  Before we closed the skin, I injected 20 mL of Exparel into the soft tissue.  Skin was closed with metal staples.  Note, at the very beginning of the case before we made the incision, I injected 20 mL of 0.5% Marcaine with epinephrine into the soft tissue to control bleeding.  Sterile dressings were applied.  The patient had 2 g of IV Ancef.  Left the operating room in satisfactory condition.          ______________________________ Georges Lynchonald A. Darrelyn HillockGioffre, M.D.     RAG/MEDQ  D:  11/28/2017  T:  11/28/2017  Job:  [id number]

## 2017-11-28 NOTE — Anesthesia Preprocedure Evaluation (Addendum)
Anesthesia Evaluation  Patient identified by MRN, date of birth, ID band Patient awake    Reviewed: Allergy & Precautions, NPO status , Patient's Chart, lab work & pertinent test results  Airway Mallampati: I  TM Distance: >3 FB Neck ROM: Full    Dental no notable dental hx.    Pulmonary neg pulmonary ROS,    Pulmonary exam normal breath sounds clear to auscultation       Cardiovascular negative cardio ROS Normal cardiovascular exam Rhythm:Regular Rate:Normal  ECG: NSR, rate 64   Neuro/Psych negative neurological ROS  negative psych ROS   GI/Hepatic negative GI ROS, Neg liver ROS,   Endo/Other  negative endocrine ROS  Renal/GU negative Renal ROS     Musculoskeletal negative musculoskeletal ROS (+)   Abdominal   Peds  Hematology negative hematology ROS (+)   Anesthesia Other Findings Herniated nucelus pulposus left  Reproductive/Obstetrics                            Anesthesia Physical Anesthesia Plan  ASA: I  Anesthesia Plan: General   Post-op Pain Management:    Induction: Intravenous  PONV Risk Score and Plan: 2 and Ondansetron, Dexamethasone, Treatment may vary due to age or medical condition and Midazolam  Airway Management Planned: Oral ETT  Additional Equipment:   Intra-op Plan:   Post-operative Plan: Extubation in OR  Informed Consent: I have reviewed the patients History and Physical, chart, labs and discussed the procedure including the risks, benefits and alternatives for the proposed anesthesia with the patient or authorized representative who has indicated his/her understanding and acceptance.   Dental advisory given  Plan Discussed with: CRNA and Surgeon  Anesthesia Plan Comments:        Anesthesia Quick Evaluation

## 2017-11-28 NOTE — Evaluation (Signed)
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  Now s/p lumbar laminectomy and microdiscecomty L4-5.  Clinical Impression  Patient presents with decreased mobility due to deficits listed in PT problem list. He will benefit from skilled PT in the acute setting to allow d/c home alone.  No follow up needs at this time.    Follow Up Recommendations No PT follow up    Equipment Recommendations  None recommended by PT    Recommendations for Other Services       Precautions / Restrictions Precautions Precautions: Back      Mobility  Bed Mobility Overal bed mobility: Needs Assistance Bed Mobility: Sidelying to Sit   Sidelying to sit: Min assist       General bed mobility comments: cues for technique, pt already sidelying, assist for trunk  Transfers Overall transfer level: Needs assistance Equipment used: Rolling walker (2 wheeled) Transfers: Sit to/from Stand Sit to Stand: Min assist         General transfer comment: cues for hand placement, to avoid bending  Ambulation/Gait Ambulation/Gait assistance: Min guard Ambulation Distance (Feet): 200 Feet Assistive device: Rolling walker (2 wheeled);None Gait Pattern/deviations: Step-through pattern;Wide base of support;Decreased weight shift to left     General Gait Details: more antalgic without walker, but stable  Stairs            Wheelchair Mobility    Modified Rankin (Stroke Patients Only)       Balance Overall balance assessment: Mild deficits observed, not formally tested                                           Pertinent Vitals/Pain Pain Assessment: 0-10 Pain Score: 7  Pain Location: back Pain Descriptors / Indicators: Sore Pain  Intervention(s): Monitored during session;Repositioned    Home Living Family/patient expects to be discharged to:: Private residence Living Arrangements: Alone Available Help at Discharge: Family;Available PRN/intermittently Type of Home: House Home Access: Stairs to enter Entrance Stairs-Rails: Doctor, general practiceight;Left Entrance Stairs-Number of Steps: 6 Home Layout: Two level Home Equipment: None Additional Comments: can borrow if needed    Prior Function Level of Independence: Independent         Comments: welder, 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     Hand Dominance        Extremity/Trunk Assessment        Lower Extremity Assessment Lower Extremity Assessment: Overall WFL for tasks assessed       Communication   Communication: No difficulties  Cognition Arousal/Alertness: Awake/alert Behavior During Therapy: WFL for tasks assessed/performed Overall Cognitive Status: Within Functional Limits for tasks assessed                                        General Comments      Exercises     Assessment/Plan    PT Assessment Patient needs continued PT services  PT Problem List Decreased mobility;Pain;Decreased knowledge of precautions;Decreased balance;Decreased activity tolerance       PT Treatment Interventions DME instruction;Functional mobility training;Balance training;Patient/family education;Gait training;Stair training;Therapeutic activities  PT Goals (Current goals can be found in the Care Plan section)  Acute Rehab PT Goals Patient Stated Goal: to go home PT Goal Formulation: With patient/family Time For Goal Achievement: 12/05/17 Potential to Achieve Goals: Good    Frequency Min 5X/week   Barriers to discharge        Co-evaluation               AM-PAC PT "6 Clicks" Daily Activity  Outcome Measure Difficulty turning over in bed (including adjusting bedclothes, sheets and blankets)?: A Little Difficulty moving from lying on back to  sitting on the side of the bed? : Unable Difficulty sitting down on and standing up from a chair with arms (e.g., wheelchair, bedside commode, etc,.)?: Unable Help needed moving to and from a bed to chair (including a wheelchair)?: A Little Help needed walking in hospital room?: A Little Help needed climbing 3-5 steps with a railing? : A Little 6 Click Score: 14    End of Session Equipment Utilized During Treatment: Gait belt Activity Tolerance: Patient tolerated treatment well Patient left: with call bell/phone within reach;in chair;with family/visitor present   PT Visit Diagnosis: Difficulty in walking, not elsewhere classified (R26.2)    Time: 1610-9604 PT Time Calculation (min) (ACUTE ONLY): 15 min   Charges:   PT Evaluation $PT Eval Low Complexity: 1 Low     PT G CodesSheran Lawless, [place] [phone] 11/28/2017   Elray Mcgregor 11/28/2017, 5:26 PM

## 2017-11-29 DIAGNOSIS — M48061 Spinal stenosis, lumbar region without neurogenic claudication: Secondary | ICD-10-CM | POA: Diagnosis not present

## 2017-11-29 MED ORDER — HYDROCODONE-ACETAMINOPHEN 10-325 MG PO TABS: 1.0000 | ORAL_TABLET | Freq: Four times a day (QID) | ORAL | 0 refills | Status: DC | PRN

## 2017-11-29 MED ORDER — METHOCARBAMOL 500 MG PO TABS: 500.0000 mg | ORAL_TABLET | Freq: Three times a day (TID) | ORAL | 1 refills | Status: DC | PRN

## 2017-11-29 NOTE — Progress Notes (Signed)
Occupational Therapy Evaluation Patient Details Name: ELMO RIO MRN: [id number] DOB: 26-May-1976 Today's Date: 11/29/2017    History of Present Illness Patient is a 42 y/o male admitted with  Herniated nucelus pulposus left at L-4-L-5.Marland KitchenSpinal Stenosis at L-4-L-5 .Partial Foot Drop on the Left.  Now s/p lumbar laminectomy and microdiscecomty L4-5.   Clinical Impression   Education complete regarding back sx and ADL activity     Follow Up Recommendations  No OT follow up    Equipment Recommendations  None recommended by OT    Recommendations for Other Services       Precautions / Restrictions Precautions Precautions: Back      Mobility Bed Mobility Overal bed mobility: Modified Independent Bed Mobility: Sidelying to Sit   Sidelying to sit: Supervision          Transfers Overall transfer level: Modified independent                    Balance Overall balance assessment: Independent                                         ADL either performed or assessed with clinical judgement   ADL Overall ADL's : Needs assistance/impaired                                       General ADL Comments: Pt overall mod I . Education provided regarding all ADL activity with back precautions.  Pt able to perform LB dressing at mod I level with VC and following back precautions.  No DMe needed     Vision Patient Visual Report: No change from baseline       Perception     Praxis      Pertinent Vitals/Pain Pain Score: 3  Pain Location: back Pain Descriptors / Indicators: Sore Pain Intervention(s): Monitored during session     Hand Dominance     Extremity/Trunk Assessment Upper Extremity Assessment Upper Extremity Assessment: Overall WFL for tasks assessed           Communication Communication Communication: No difficulties   Cognition Arousal/Alertness: Awake/alert Behavior During Therapy: WFL for tasks  assessed/performed Overall Cognitive Status: Within Functional Limits for tasks assessed                                                Home Living Family/patient expects to be discharged to:: Private residence Living Arrangements: Alone Available Help at Discharge: Family;Available PRN/intermittently Type of Home: House Home Access: Stairs to enter [employer] of Steps: 6 Entrance Stairs-Rails: Right;Left Home Layout: Two level Alternate Level Stairs-Number of Steps: flight Alternate Level Stairs-Rails: Left Bathroom Shower/Tub: Producer, television/film/video: Handicapped height     Home Equipment: None   Additional Comments: can borrow if needed      Prior Functioning/Environment Level of Independence: Independent        Comments: welder, 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        OT Problem List:        OT Treatment/Interventions:      OT Goals(Current goals can be found in the care plan section) Acute Rehab OT Goals Patient  Stated Goal: to go home  OT Frequency:      AM-PAC PT "6 Clicks" Daily Activity     Outcome Measure Help from another person eating meals?: None Help from another person taking care of personal grooming?: None Help from another person toileting, which includes using toliet, bedpan, or urinal?: None Help from another person bathing (including washing, rinsing, drying)?: None Help from another person to put on and taking off regular upper body clothing?: None Help from another person to put on and taking off regular lower body clothing?: None 6 Click Score: 24   End of Session Nurse Communication: Mobility status  Activity Tolerance: Patient tolerated treatment well Patient left: in chair;with call bell/phone within reach                   Time: [id number] OT Time Calculation (min): 16 min Charges:  OT Evaluation $OT Eval Low Complexity: 1 Low G-Codes:        Zuhayr Deeney, [place] KungLorraine D 11/29/2017, 10:12  AM

## 2017-11-29 NOTE — Progress Notes (Signed)
PHYSICAL THERAPY  Pt OOB, dressed and eager to D/C to home.  Pt stated he amb unit earlier with out any AD or c/o.  Pt properly demonstrate "log roll" to get in/OOB.  Reeducated pt on his back precautions with demonstration and teach back tech. Instructed on use of ICE only, no heat.   All mobility questions addressed.   Tx session only 6 min so no charge.  Felecia ShellingLori Marianny Goris  PTA [facility]  Acute  Rehab Pager      [phone]

## 2017-11-29 NOTE — Progress Notes (Signed)
Subjective: 1 Day Post-Op Procedure(s) (LRB): Central decompression lumbar laminectomy L4-L5 left (Left) Patient reports pain as 1 on 0-10 scale. Doing well. No longer has a foot drop. Will DC   Objective: Vital signs in last 24 hours: Temp:  [97.4 F (36.3 C)-98.2 F (36.8 C)] 98 F (36.7 C) (03/07 0120) Pulse Rate:  [53-73] 66 (03/07 0120) Resp:  [12-18] 15 (03/07 0120) BP: (101-118)/(52-82) 107/59 (03/07 0120) SpO2:  [97 %-100 %] 98 % (03/07 0120) Weight:  [93 kg (205 lb)] 93 kg (205 lb) (03/06 1003)  Intake/Output from previous day: 03/06 0701 - 03/07 0700 In: 3490 [P.O.:820; I.V.:2620; IV Piggyback:50] Out: 700 [Urine:650; Blood:50] Intake/Output this shift: No intake/output data recorded.  Recent Labs    11/27/17 1317  HGB 18.8*   Recent Labs    11/27/17 1317  WBC 8.8  RBC 5.92*  HCT 53.0*  PLT 299   Recent Labs    11/27/17 1317  NA 139  K 4.6  CL 101  CO2 28  BUN 17  CREATININE 1.12  GLUCOSE 72  CALCIUM 9.3   Recent Labs    11/27/17 1317  INR 1.04    Neurologically intact  Assessment/Plan: 1 Day Post-Op Procedure(s) (LRB): Central decompression lumbar laminectomy L4-L5 left (Left) Up with therapy.Will DC  Ranee Gosselinonald Marshea Wisher 11/29/2017, 7:27 AM

## 2017-12-06 NOTE — Discharge Summary (Signed)
Physician Discharge Summary   Patient ID: Vincent Durham MRN: [id number] DOB/AGE: 1976-03-29 42 y.o.  Admit date: 11/28/2017 Discharge date: 11/29/2017  Primary Diagnosis: Lumbar spinal stenosis and disc herniaton  Admission Diagnoses:  History reviewed. No pertinent past medical history. Discharge Diagnoses:   Active Problems:   Herniated intervertebral disc of lumbar spine  Estimated body mass index is 26.32 kg/m as calculated from the following:   Height as of this encounter: 6' 2"  (1.88 m).   Weight as of this encounter: 93 kg (205 lb).  Procedure:  Procedure(s) (LRB): Central decompression lumbar laminectomy L4-L5 left (Left)   Consults: None  HPI: The patient presented to the office with the chief complaint of low back pain. He also developed left leg pain and weakness. MRI showed a disc herniation at L4-L5 on the left. He did not see improvement with conservative treatments.   Laboratory Data: Hospital Outpatient Visit on 11/27/2017  Component Date Value Ref Range Status  . aPTT 11/27/2017 33  24 - 36 seconds Final   Performed at [facility], [place] [address], [place], [place] [address]  . WBC 11/27/2017 8.8  4.0 - 10.5 K/uL Final  . RBC 11/27/2017 5.92* 4.22 - 5.81 MIL/uL Final  . Hemoglobin 11/27/2017 18.8* 13.0 - 17.0 g/dL Final  . HCT 11/27/2017 53.0* 39.0 - 52.0 % Final  . MCV 11/27/2017 89.5  78.0 - 100.0 fL Final  . MCH 11/27/2017 31.8  26.0 - 34.0 pg Final  . MCHC 11/27/2017 35.5  30.0 - 36.0 g/dL Final  . RDW 11/27/2017 12.4  11.5 - 15.5 % Final  . Platelets 11/27/2017 299  150 - 400 K/uL Final  . Neutrophils Relative % 11/27/2017 73  % Final  . Neutro Abs 11/27/2017 6.5  1.7 - 7.7 K/uL Final  . Lymphocytes Relative 11/27/2017 17  % Final  . Lymphs Abs 11/27/2017 1.5  0.7 - 4.0 K/uL Final  . Monocytes Relative 11/27/2017 8  % Final  . Monocytes Absolute 11/27/2017 0.7  0.1 - 1.0 K/uL Final  . Eosinophils Relative 11/27/2017 1  % Final    . Eosinophils Absolute 11/27/2017 0.1  0.0 - 0.7 K/uL Final  . Basophils Relative 11/27/2017 1  % Final  . Basophils Absolute 11/27/2017 0.1  0.0 - 0.1 K/uL Final   Performed at [facility], [place] [address] [place], [place] [address]  . Sodium 11/27/2017 139  135 - 145 mmol/L Final  . Potassium 11/27/2017 4.6  3.5 - 5.1 mmol/L Final  . Chloride 11/27/2017 101  101 - 111 mmol/L Final  . CO2 11/27/2017 28  22 - 32 mmol/L Final  . Glucose, Bld 11/27/2017 72  65 - 99 mg/dL Final  . BUN 11/27/2017 17  6 - 20 mg/dL Final  . Creatinine, Ser 11/27/2017 1.12  0.61 - 1.24 mg/dL Final  . Calcium 11/27/2017 9.3  8.9 - 10.3 mg/dL Final  . Total Protein 11/27/2017 7.6  6.5 - 8.1 g/dL Final  . Albumin 11/27/2017 4.0  3.5 - 5.0 g/dL Final  . AST 11/27/2017 33  15 - 41 U/L Final  . ALT 11/27/2017 45  17 - 63 U/L Final  . Alkaline Phosphatase 11/27/2017 70  38 - 126 U/L Final  . Total Bilirubin 11/27/2017 0.8  0.3 - 1.2 mg/dL Final  . GFR calc non Af Amer 11/27/2017 >60  >60 mL/min Final  . GFR calc Af Amer 11/27/2017 >60  >60 mL/min Final   Comment: (NOTE) The  eGFR has been calculated using the CKD EPI equation. This calculation has not been validated in all clinical situations. eGFR's persistently <60 mL/min signify possible Chronic Kidney Disease.   Georgiann Hahn gap 11/27/2017 10  5 - 15 Final   Performed at [facility], [place] [address] [place], [place] [address]  . Prothrombin Time 11/27/2017 13.5  11.4 - 15.2 seconds Final  . INR 11/27/2017 1.04   Final   Performed at [facility], [place] [address] [place], [place] [address]  . Color, Urine 11/27/2017 COLORLESS* YELLOW Final  . APPearance 11/27/2017 CLEAR  CLEAR Final  . Specific Gravity, Urine 11/27/2017 1.002* 1.005 - 1.030 Final  . pH 11/27/2017 7.0  5.0 - 8.0 Final  . Glucose, UA 11/27/2017 NEGATIVE  NEGATIVE mg/dL Final  . Hgb urine dipstick 11/27/2017 NEGATIVE  NEGATIVE Final  .  Bilirubin Urine 11/27/2017 NEGATIVE  NEGATIVE Final  . Ketones, ur 11/27/2017 NEGATIVE  NEGATIVE mg/dL Final  . Protein, ur 11/27/2017 NEGATIVE  NEGATIVE mg/dL Final  . Nitrite 11/27/2017 NEGATIVE  NEGATIVE Final  . Leukocytes, UA 11/27/2017 NEGATIVE  NEGATIVE Final   Performed at [place] [address] [place], [place] [address]  . MRSA, PCR 11/27/2017 NEGATIVE  NEGATIVE Final  . Staphylococcus aureus 11/27/2017 NEGATIVE  NEGATIVE Final   Comment: (NOTE) The Xpert SA Assay (FDA approved for NASAL specimens in patients 21 years of age and older), is one component of a comprehensive surveillance program. It is not intended to diagnose infection nor to guide or monitor treatment. Performed at [facility], [place] [address] [place], [place] [address]      X-Rays:Dg Chest 2 View  Result Date: 11/27/2017 CLINICAL DATA:  Pre-op respiratory exam for lumbar spine surgery. EXAM: CHEST - 2 VIEW COMPARISON:  None. FINDINGS: The heart size and mediastinal contours are within normal limits. Both lungs are clear. The visualized skeletal structures are unremarkable. IMPRESSION: Negative.  No active cardiopulmonary disease. Electronically Signed   By: Earle Gell M.D.   On: 11/27/2017 17:04   Dg Lumbar Spine 2-3 Views  Result Date: 11/27/2017 CLINICAL DATA:  Preop back surgery. EXAM: LUMBAR SPINE - 2-3 VIEW COMPARISON:  11/14/2017 FINDINGS: Five lumbar vertebra. These were labeled for preoperative planning purposes. Normal lumbar alignment. Negative for fracture or mass. No pars defect. SI joints intact. Minimal anterior spurring L3-4. Otherwise no significant disc space narrowing. IMPRESSION: Five lumbar vertebral segments.  No acute abnormality. Electronically Signed   By: Franchot Gallo M.D.   On: 11/27/2017 17:08   Dg Lumbar Spine 2-3 Views  Result Date: 11/14/2017 CLINICAL DATA:  Back pain since December EXAM: LUMBAR SPINE - 2-3 VIEW COMPARISON:  03/14/2013  FINDINGS: There are 5 non rib-bearing lumbar type vertebral bodies There is a very mild (under 5 degrees) scoliotic curvature of the thoracolumbar spine with dominant caudal component convex to the left, potentially positional. No anterolisthesis or retrolisthesis. Mild (approximately 25%) compression deformity involving the superior endplate of E70 is unchanged compared to the 02/2013 examination. Remaining lumbar vertebral body heights appear preserved given obliquity. Mild multilevel lumbar spine DDD, worse at L3-L4 with disc space height loss, endplate irregularity and sclerosis. Limited visualization of the bilateral SI joints is normal. Regional bowel gas pattern is normal. IMPRESSION: Mild multilevel lumbar spine DDD, worse at L3-L4. Electronically Signed   By: Sandi Mariscal M.D.   On: 11/14/2017 09:18   Dg Spine Portable 1 View  Result Date: 11/28/2017 CLINICAL DATA:  Localization radiograph prior to L4-5 lumbar laminectomy. EXAM: PORTABLE SPINE - 1 VIEW COMPARISON:  AP and lateral views of the lumbar spine of November 27, 2017 FINDINGS: There is a tissue spreader device present overlying the L4-5 spinous processes. A metallic probe projects over the lamina of L4 and is approximately 2.8 cm posterior to the posterosuperior margin of the body of L4. IMPRESSION: Lateral localization radiograph of the lumbar spine with findings as described above. Electronically Signed   By: David  Martinique M.D.   On: 11/28/2017 12:16   Dg Spine Portable 1 View  Result Date: 11/28/2017 CLINICAL DATA:  Intraoperative localization EXAM: PORTABLE SPINE - 1 VIEW COMPARISON:  None. FINDINGS: Single cross-table lateral x-ray of the lumbar spine is provided. Posterior metallic instrument with the tip projecting over the L4 spinous process. IMPRESSION: Intraoperative localization as described above. Electronically Signed   By: Kathreen Devoid   On: 11/28/2017 11:58   Dg Spine Portable 1 View  Result Date: 11/28/2017 CLINICAL DATA:   Lumbar laminectomy L4-5 EXAM: PORTABLE SPINE - 1 VIEW COMPARISON:  11/27/2017 FINDINGS: Single intraoperative image in the operating room of the lumbar spine. Normal alignment. No acute bony abnormality. 2 needles are present posterior to the L4 spinous process. IMPRESSION: L4 spinous process localized in the operating room. Electronically Signed   By: Franchot Gallo M.D.   On: 11/28/2017 11:41    EKG: Orders placed or performed during the hospital encounter of 11/27/17  . EKG  . EKG     Hospital Course: VASHON RIORDAN is a 42 y.o. who was admitted to [facility]. They were brought to the operating room on 11/28/2017 and underwent Procedure(s): Central decompression lumbar laminectomy L4-L5 left.  Patient tolerated the procedure well and was later transferred to the recovery room and then to the orthopaedic floor for postoperative care.  They were given PO and IV analgesics for pain control following their surgery.  They were given 24 hours of postoperative antibiotics of  Anti-infectives (From admission, onward)   Start     Dose/Rate Route Frequency Ordered Stop   11/28/17 2200  ceFAZolin (ANCEF) IVPB 1 g/50 mL premix  Status:  Discontinued     1 g 100 mL/hr over 30 Minutes Intravenous Every 8 hours 11/28/17 1438 11/29/17 1309   11/28/17 1140  polymyxin B 500,000 Units, bacitracin 50,000 Units in sodium chloride 0.9 % 500 mL irrigation  Status:  Discontinued       As needed 11/28/17 1140 11/28/17 1300   11/28/17 0958  ceFAZolin (ANCEF) IVPB 2g/100 mL premix     2 g 200 mL/hr over 30 Minutes Intravenous On call to O.R. 11/28/17 2025 11/28/17 1129     and started on DVT prophylaxis in the form of Aspirin.   PT was ordered. Discharge planning consulted to help with postop disposition and equipment needs.  Patient had a fair night on the evening of surgery.  They started to get up OOB with therapy on day one. Dressing was changed and the incision was clean and dry.  The patient had  progressed with therapy and meeting their goals.  Incision was healing well.  Patient was seen in rounds and was ready to go home.   Diet: Regular diet Activity:WBAT Follow-up:in 2 weeks Disposition - Home Discharged Condition: stable   Discharge Instructions    Call MD / Call 911   Complete by:  As directed    If you experience chest pain or shortness of breath, CALL 911  and be transported to the hospital emergency room.  If you develope a fever above 101 F, pus (white drainage) or increased drainage or redness at the wound, or calf pain, call your surgeon's office.   Constipation Prevention   Complete by:  As directed    Drink plenty of fluids.  Prune juice may be helpful.  You may use a stool softener, such as Colace (over the counter) 100 mg twice a day.  Use MiraLax (over the counter) for constipation as needed.   Diet general   Complete by:  As directed    Discharge instructions   Complete by:  As directed    For the first three days, remove your dressing, and tape a piece of saran wrap over your incision. Take your shower, then remove the saran wrap and put a clean dressing on, then reapply your sling. After three days you can shower without the saran wrap. No lifting or excessive bending  No driving while taking pain medications.  Call Dr. Gladstone Lighter if any wound complications or temperature of 101 degrees F or over.  Call the office for an appointment to see Dr. Gladstone Lighter in two weeks: [phone] and ask for Dr. Charlestine Night nurse, Brunilda Payor.   Increase activity slowly as tolerated   Complete by:  As directed      Allergies as of 11/29/2017   No Known Allergies     Medication List    STOP taking these medications   tizanidine 2 MG capsule Commonly known as:  ZANAFLEX     TAKE these medications   acetaminophen 500 MG tablet Commonly known as:  TYLENOL Take 1,000 mg by mouth every 6 (six) hours as needed for moderate pain or headache.   HYDROcodone-acetaminophen  10-325 MG tablet Commonly known as:  NORCO Take 1 tablet by mouth every 6 (six) hours as needed for moderate pain ((score 4 to 6)).   ibuprofen 200 MG tablet Commonly known as:  ADVIL,MOTRIN Take 600 mg by mouth every 6 (six) hours as needed for headache or moderate pain.   methocarbamol 500 MG tablet Commonly known as:  ROBAXIN Take 1 tablet (500 mg total) by mouth every 8 (eight) hours as needed for muscle spasms.      Follow-up Information    Latanya Maudlin, MD. Schedule an appointment as soon as possible for a visit in 2 week(s).   Specialty:  Orthopedic Surgery Contact information: [address] [place] [address] [phone]           Signed: Ardeen Jourdain, PA-C Orthopaedic Surgery 12/06/2017, 1:54 PM

## 2017-12-17 ENCOUNTER — Ambulatory Visit: Payer: 59 | Attending: Orthopedic Surgery | Admitting: Physical Therapy

## 2017-12-17 DIAGNOSIS — M545 Low back pain: Secondary | ICD-10-CM | POA: Insufficient documentation

## 2017-12-17 NOTE — Therapy (Signed)
[facility] [address] [place], [place], [address] Phone: [phone]   Fax:  [phone]  Physical Therapy Evaluation  Patient Details  Name: Vincent Durham MRN: [id number] Date of Birth: 05/18/1976 Referring Provider: Ranee Gosselin, MD   Encounter Date: 12/17/2017  PT End of Session - 12/17/17 1012    Visit Number  1    Number of Visits  18    Date for PT Re-Evaluation  01/28/18    Authorization Type  FOTO every 5th visit    PT Start Time  0902    PT Stop Time  0945    PT Time Calculation (min)  43 min    Activity Tolerance  Patient tolerated treatment well    Behavior During Therapy  [facility] for tasks assessed/performed       No past medical history on file.  Past Surgical History:  Procedure Laterality Date  . KNEE SURGERY Right   . KNEE SURGERY Left   . left wrist surgery     . LUMBAR LAMINECTOMY/DECOMPRESSION MICRODISCECTOMY Left 11/28/2017   Procedure: Central decompression lumbar laminectomy L4-L5 left;  Surgeon: Ranee Gosselin, MD;  Location: [facility];  Service: Orthopedics;  Laterality: Left;    There were no vitals filed for this visit.   Subjective Assessment - 12/17/17 1020    Subjective  Patient presents to physical therapy status post lumbar laminectomy, 11/28/17. Patient reported prior to his surgery, he had difficulty with bending and walking with sharp pain during work activities. Patient MRI reported a ruptured disc at L4-L5 level. Patient reported he elected to have surgery. Patient stated did not receive therapy in the hospital. Patient recited his protocol: unable to lift or bend per MD. Patient reports minimal pain and reports more discomfort in lumbar spine, 2/10 at worst with increased activity. Patient reports he does not have any difficulties with standing, walking, or sitting for long periods of time. Patient denies any numbness or tingling. Patient reported he is out of work for at least 1 month; he is  required to lift 70 lbs to return to work. Patient's goals would be to get better, be 100% to return to work, and return to recreational activities.     Limitations  House hold activities;Lifting    Diagnostic tests  MRI: ruptured disc at L4-L5    Patient Stated Goals  Get better, lift 70 lbs to return to work, return to recreational activities.    Currently in Pain?  Yes    Pain Score  2     Pain Location  Back    Pain Orientation  Lower    Pain Descriptors / Indicators  Discomfort    Pain Type  Surgical pain    Pain Onset  1 to 4 weeks ago    Pain Frequency  Intermittent    Aggravating Factors   doing too much    Pain Relieving Factors  rest, pain medication         [facility] PT Assessment - 12/17/17 0001      Assessment   Medical Diagnosis  s/p lumbar laminectomy    Referring Provider  Ranee Gosselin, MD    Onset Date/Surgical Date  11/28/17    Next MD Visit  12/25/17    Prior Therapy  No      Precautions   Precautions  Back    Precaution Comments  No bending, lifting, or twisting.      Restrictions   Weight Bearing Restrictions  No  Balance Screen   Has the patient fallen in the past 6 months  No    Has the patient had a decrease in activity level because of a fear of falling?   Yes    Is the patient reluctant to leave their home because of a fear of falling?   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 Increased edema around scar    Focus on Therapeutic Outcomes  (FOTO)   57% limited      Sensation   Light Touch  Appears Intact      Posture/Postural Control   Posture Comments  sitting with upright posture with no back support      ROM / Strength   AROM / PROM / Strength  Strength      Strength   Overall Strength  Within functional limits for tasks performed    Overall Strength Comments  Bilateral DF 5/5; no reports of pain     Strength Assessment Site  Hip;Knee;Ankle    Right/Left Hip  Right;Left    Right Hip Flexion  4+/5    Right Hip Extension  4/5    Right Hip ABduction  5/5    Left Hip Flexion  4+/5    Left Hip Extension  4/5    Left Hip ABduction  4+/5    Right/Left Knee  Right;Left    Right Knee Flexion  4+/5    Right Knee Extension  5/5    Left Knee Flexion  4+/5    Left Knee Extension  5/5      Flexibility   Soft Tissue Assessment /Muscle Length  yes    Hamstrings  60 degrees bilaterally              No data recorded  Objective measurements completed on examination: See above findings.      [facility] Adult PT Treatment/Exercise - 12/17/17 0001      Exercises   Exercises  Lumbar      Lumbar Exercises: Aerobic   Stationary Bike  Level 5, x11 minutes with emphasis on draw in             PT Education - 12/17/17 1025    Education provided  Yes    Education Details  Draw in in supine, sitting, and standing.    Person(s) Educated  Patient    Methods  Explanation;Demonstration;Verbal cues;Tactile cues    Comprehension  Verbalized understanding;Returned demonstration          PT Long Term Goals - 12/17/17 1026      PT LONG TERM GOAL #1   Title  Patient will be independent with HEP.    Time  6    Period  Weeks    Status  New    Target Date  01/28/18      PT  LONG TERM GOAL #2   Title  Patient will improve hip strength MMT to 5/5 in all planes to improve stability during functional activities.    Time  6    Period  Weeks    Status  New    Target Date  01/28/18      PT LONG TERM GOAL #3    Title  Patient will report 0/10 pain during ADLs and recreational activities.    Time  6    Status  New    Target Date  01/28/18      PT LONG TERM GOAL #4   Title  Patient will demonstrate proper lifting mechanics to protect spine during work activities.    Period  Weeks    Target Date  01/28/18             Plan - 12/17/17 1016    Clinical Impression Statement  Patient is a 42 year old male who presents to physical therapy status post lumbar laminectomy at L4-L5 on 11/28/17. Patient demonstrates good sitting posture and he can recite his back precautions. Patient demonstrates good strength and good hamstring flexibility. Patient reported no pain with stationary bike. FOTO limitation: 57%. Patient would benefit from skilled physical therapy to improve core strength and stabilization, LE strength, and educate on proper lifting body mechanics to return to PLOF and return to work.     Clinical Presentation  Stable    Clinical Decision Making  Low    Rehab Potential  Excellent    PT Frequency  3x / week    PT Duration  6 weeks    PT Treatment/Interventions  Cryotherapy;Electrical Stimulation;Moist Heat;Therapeutic exercise;Therapeutic activities;Scar mobilization;Manual techniques;Patient/family education;Passive range of motion;ADLs/Self Care Home Management;Neuromuscular re-education;Stair training;Balance training    PT Next Visit Plan  begin on bike, core stabilization exercises, with respect to no bending lifitng protocol, modalities PRN for pain relief.    PT Home Exercise Plan  Draw in    Consulted and Agree with Plan of Care  Patient       Patient will benefit from skilled therapeutic intervention in order to improve the following deficits and impairments:  Pain, Decreased activity tolerance, Decreased endurance, Decreased strength  Visit Diagnosis: Acute low back pain, unspecified back pain laterality, with sciatica presence unspecified     Problem List Patient Active  Problem List   Diagnosis Date Noted  . Herniated intervertebral disc of lumbar spine 11/28/2017    Guss Bunde, PT, DPT 12/17/2017, 12:20 PM  [facility] [address] [place], [place], [address] Phone: [phone]   Fax:  [phone]  Name: Vincent Durham MRN: [id number] Date of Birth: 04/26/1976

## 2017-12-19 ENCOUNTER — Encounter: Payer: 59 | Admitting: Physical Therapy

## 2017-12-21 ENCOUNTER — Ambulatory Visit: Payer: 59 | Admitting: Physical Therapy

## 2017-12-21 DIAGNOSIS — M545 Low back pain: Secondary | ICD-10-CM | POA: Diagnosis not present

## 2017-12-21 NOTE — Therapy (Signed)
[facility] [address] [place], [place], [address] Phone: [phone]   Fax:  [phone]  Physical Therapy Treatment  Patient Details  Name: Vincent Durham MRN: [id number] Date of Birth: 17-Apr-1976 Referring Provider: Ranee Gosselin, MD   Encounter Date: 12/21/2017  PT End of Session - 12/21/17 0905    Visit Number  2    Number of Visits  18    Date for PT Re-Evaluation  01/28/18    Authorization Type  FOTO every 5th visit    PT Start Time  0900    PT Stop Time  0956    PT Time Calculation (min)  56 min    Activity Tolerance  Patient tolerated treatment well    Behavior During Therapy  [facility] for tasks assessed/performed       No past medical history on file.  Past Surgical History:  Procedure Laterality Date  . KNEE SURGERY Right   . KNEE SURGERY Left   . left wrist surgery     . LUMBAR LAMINECTOMY/DECOMPRESSION MICRODISCECTOMY Left 11/28/2017   Procedure: Central decompression lumbar laminectomy L4-L5 left;  Surgeon: Ranee Gosselin, MD;  Location: [facility];  Service: Orthopedics;  Laterality: Left;    There were no vitals filed for this visit.  Subjective Assessment - 12/21/17 0906    Subjective  Patient reports feeling stiff; pain is about 2/10. I have been doing my draw ins.    Limitations  House hold activities;Lifting    Diagnostic tests  MRI: ruptured disc at L4-L5    Patient Stated Goals  Get better, lift 70 lbs to return to work, return to recreational activities.    Currently in Pain?  Yes    Pain Score  2     Pain Location  Back    Pain Orientation  Lower    Pain Descriptors / Indicators  -- Stiff    Pain Type  Surgical pain    Pain Onset  1 to 4 weeks ago         [facility] PT Assessment - 12/21/17 0001      Assessment   Medical Diagnosis  s/p lumbar laminectomy    Onset Date/Surgical Date  11/28/17    Next MD Visit  12/25/17    Prior Therapy  No      Precautions   Precautions  Back    Precaution  Comments  No bending, lifting, or twisting.            No data recorded       [facility] Adult PT Treatment/Exercise - 12/21/17 0001      Exercises   Exercises  Lumbar      Lumbar Exercises: Aerobic   Stationary Bike  Level 5, x12 minutes with emphasis on draw in      Lumbar Exercises: Standing   Row  Strengthening;Both;20 reps Pink XTS    Shoulder Extension  Strengthening;20 reps Pink XTS    Other Standing Lumbar Exercises  lunges with shoulder press out 4# ball x10      Lumbar Exercises: Supine   Bent Knee Raise  10 reps up/up/down/down x10 then 90/90 tap downs x10    Bridge  10 reps;20 reps;2 seconds      Lumbar Exercises: Quadruped   Straight Leg Raise  15 reps      Modalities   Modalities  Cryotherapy;Electrical Stimulation      Cryotherapy   Number Minutes Cryotherapy  10 Minutes    Cryotherapy Location  Lumbar Spine  Type of Cryotherapy  Ice pack      Electrical Stimulation   Electrical Stimulation Location  Low back    Electrical Stimulation Action  Pre-mod    Electrical Stimulation Parameters  80-150 hz x10    Electrical Stimulation Goals  Pain      Manual Therapy   Manual Therapy  Soft tissue mobilization    Soft tissue mobilization  Scar massage to improve mobility; avoiding scab at superior aspect.                  PT Long Term Goals - 12/17/17 1026      PT LONG TERM GOAL #1   Title  Patient will be independent with HEP.    Time  6    Period  Weeks    Status  New    Target Date  01/28/18      PT LONG TERM GOAL #2   Title  Patient will improve hip strength MMT to 5/5 in all planes to improve stability during functional activities.    Time  6    Period  Weeks    Status  New    Target Date  01/28/18      PT LONG TERM GOAL #3   Title  Patient will report 0/10 pain during ADLs and recreational activities.    Time  6    Status  New    Target Date  01/28/18      PT LONG TERM GOAL #4   Title  Patient will demonstrate proper  lifting mechanics to protect spine during work activities.    Period  Weeks    Target Date  01/28/18              Patient will benefit from skilled therapeutic intervention in order to improve the following deficits and impairments:     Visit Diagnosis: Acute low back pain, unspecified back pain laterality, with sciatica presence unspecified     Problem List Patient Active Problem List   Diagnosis Date Noted  . Herniated intervertebral disc of lumbar spine 11/28/2017   Guss BundeKrystle Alayia Meggison, PT, DPT 12/21/2017, 12:53 PM  [facility] [address] [place], [place], [address] Phone: [phone]   Fax:  [phone]  Name: Vincent Durham MRN: [id number] Date of Birth: [birth year]

## 2017-12-25 ENCOUNTER — Ambulatory Visit: Payer: 59 | Attending: Orthopedic Surgery | Admitting: Physical Therapy

## 2017-12-25 ENCOUNTER — Encounter: Payer: Self-pay | Admitting: Physical Therapy

## 2017-12-25 DIAGNOSIS — M545 Low back pain: Secondary | ICD-10-CM | POA: Diagnosis not present

## 2017-12-25 NOTE — Therapy (Signed)
[facility] [address] [place], [place], [address] Phone: [phone]   Fax:  [phone]  Physical Therapy Treatment  Patient Details  Name: Vincent Durham MRN: [id number] Date of Birth: 09/14/1976 Referring Provider: Ranee Gosselin, MD   Encounter Date: 12/25/2017  PT End of Session - 12/25/17 1020    Visit Number  3    Number of Visits  18    Date for PT Re-Evaluation  01/28/18    Authorization Type  FOTO every 5th visit    PT Start Time  0900    PT Stop Time  0955    PT Time Calculation (min)  55 min    Activity Tolerance  Patient tolerated treatment well    Behavior During Therapy  [facility] for tasks assessed/performed       History reviewed. No pertinent past medical history.  Past Surgical History:  Procedure Laterality Date  . KNEE SURGERY Right   . KNEE SURGERY Left   . left wrist surgery     . LUMBAR LAMINECTOMY/DECOMPRESSION MICRODISCECTOMY Left 11/28/2017   Procedure: Central decompression lumbar laminectomy L4-L5 left;  Surgeon: Ranee Gosselin, MD;  Location: [facility];  Service: Orthopedics;  Laterality: Left;    There were no vitals filed for this visit.  Subjective Assessment - 12/25/17 0902    Subjective  I mowed my lawn.  Feel some burning in my low back today.  I plugged a big yard.    Diagnostic tests  MRI: ruptured disc at L4-L5    Currently in Pain?  Yes    Pain Score  3     Pain Location  Back    Pain Orientation  Lower    Pain Descriptors / Indicators  Burning    Pain Type  Surgical pain    Pain Onset  1 to 4 weeks ago                       [facility] Adult PT Treatment/Exercise - 12/25/17 0001      Exercises   Exercises  Lumbar      Lumbar Exercises: Aerobic   Stationary Bike  Level 5 x 15 minutes.      Lumbar Exercises: Supine   Other Supine Lumbar Exercises  SKTC with 30 sec holds 3 reps each side.    Other Supine Lumbar Exercises  Hip bridges to fatigue x 2.      Programmer, applications Location  -- Left low back.    Electrical Stimulation Action  Pre-mod.    Electrical Stimulation Parameters  80-150 Hz x 20 minutes.    Electrical Stimulation Goals  Tone;Pain      Manual Therapy   Manual Therapy  Soft tissue mobilization    Soft tissue mobilization  Left QL tender to palpation..performed left QL release x 5 minutes.                  PT Long Term Goals - 12/17/17 1026      PT LONG TERM GOAL #1   Title  Patient will be independent with HEP.    Time  6    Period  Weeks    Status  New    Target Date  01/28/18      PT LONG TERM GOAL #2   Title  Patient will improve hip strength MMT to 5/5 in all planes to improve stability during functional activities.    Time  6  Period  Weeks    Status  New    Target Date  01/28/18      PT LONG TERM GOAL #3   Title  Patient will report 0/10 pain during ADLs and recreational activities.    Time  6    Status  New    Target Date  01/28/18      PT LONG TERM GOAL #4   Title  Patient will demonstrate proper lifting mechanics to protect spine during work activities.    Period  Weeks    Target Date  01/28/18            Plan - 12/25/17 1021    Clinical Impression Statement  Patient into clinic today with a burning pain in low back.  He had down a lot of yardwork which included mowing (zero-turn) mower and plugging a large yard.  He responded well to treatment today which included left QL release technique which was tender to palpation.    Clinical Presentation  Stable    Rehab Potential  Excellent    PT Frequency  3x / week    PT Duration  6 weeks    PT Treatment/Interventions  Cryotherapy;Electrical Stimulation;Moist Heat;Therapeutic exercise;Therapeutic activities;Scar mobilization;Manual techniques;Patient/family education;Passive range of motion;ADLs/Self Care Home Management;Neuromuscular re-education;Stair training;Balance training    PT Next Visit Plan  begin on bike,  core stabilization exercises, with respect to no bending lifitng protocol, modalities PRN for pain relief.    PT Home Exercise Plan  Draw ins and bridges    Consulted and Agree with Plan of Care  Patient       Patient will benefit from skilled therapeutic intervention in order to improve the following deficits and impairments:  Pain, Decreased activity tolerance, Decreased endurance, Decreased strength  Visit Diagnosis: Acute low back pain, unspecified back pain laterality, with sciatica presence unspecified     Problem List Patient Active Problem List   Diagnosis Date Noted  . Herniated intervertebral disc of lumbar spine 11/28/2017    Becky Colan, [place] MPT 12/25/2017, 10:32 AM  [facility] [address] [place], [place], [address] Phone: [phone]   Fax:  [phone]  Name: Vincent Durham MRN: [id number] Date of Birth: December 11, 1975

## 2017-12-31 ENCOUNTER — Ambulatory Visit: Payer: 59 | Admitting: Physical Therapy

## 2017-12-31 ENCOUNTER — Encounter: Payer: Self-pay | Admitting: Physical Therapy

## 2017-12-31 DIAGNOSIS — M545 Low back pain: Secondary | ICD-10-CM

## 2017-12-31 NOTE — Therapy (Signed)
[facility] [address] [place], [place], [address] Phone: [phone]   Fax:  [phone]  Physical Therapy Treatment  Patient Details  Name: Vincent Durham MRN: [id number] Date of Birth: 1975/12/21 Referring Provider: Ranee Gosselinonald Gioffre, MD   Encounter Date: 12/31/2017  PT End of Session - 12/31/17 0906    Visit Number  4    Number of Visits  18    Date for PT Re-Evaluation  01/28/18    Authorization Type  FOTO every 5th visit    PT Start Time  0903    PT Stop Time  0943    PT Time Calculation (min)  40 min    Activity Tolerance  Patient tolerated treatment well    Behavior During Therapy  [facility] for tasks assessed/performed       History reviewed. No pertinent past medical history.  Past Surgical History:  Procedure Laterality Date  . KNEE SURGERY Right   . KNEE SURGERY Left   . left wrist surgery     . LUMBAR LAMINECTOMY/DECOMPRESSION MICRODISCECTOMY Left 11/28/2017   Procedure: Central decompression lumbar laminectomy L4-L5 left;  Surgeon: Ranee GosselinGioffre, Ronald, MD;  Location: [facility];  Service: Orthopedics;  Laterality: Left;    There were no vitals filed for this visit.  Subjective Assessment - 12/31/17 0905    Subjective  Reports that his ultimate goal is to return to work May 1st and he has to lift up to 70# from the floor. 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     Limitations  House hold activities;Lifting    Diagnostic tests  MRI: ruptured disc at L4-L5    Patient Stated Goals  Get better, lift 70 lbs to return to work, return to recreational activities.    Currently in Pain?  No/denies Reports stiffness         [facility] PT Assessment - 12/31/17 0001      Assessment   Medical Diagnosis  s/p lumbar laminectomy    Onset Date/Surgical Date  11/28/17    Next MD Visit  01/18/2018    Prior Therapy  No      Precautions   Precautions  Back    Precaution Comments  No bending, lifting, or twisting.      Restrictions   Weight Bearing Restrictions  No                   [facility] Adult PT Treatment/Exercise - 12/31/17 0001      Lumbar Exercises: Aerobic   Elliptical  L5, R5 x10 min      Lumbar Exercises: Machines for Strengthening   Leg Press  3.5# x30 reps with core     Other Lumbar Machine Exercise  Chest press 50# x30 reps  with core, Row 50# x30 reps    Other Lumbar Machine Exercise  Lat pulldown 50# x30 reps      Lumbar Exercises: Standing   Lifting  From floor;From 12";20 reps 21# and 24#      Lumbar Exercises: Supine   Bent Knee Raise  20 reps 90/90 position    Bridge with Ball Squeeze  15 reps;5 seconds;Limitations    Bridge with Harley-DavidsonBall Squeeze Limitations  4# ball squeeze      Lumbar Exercises: Sidelying   Clam  Both;15 reps      Lumbar Exercises: Prone   Opposite Arm/Leg Raise  Right arm/Left leg;Left arm/Right leg;10 reps  PT Long Term Goals - 12/17/17 1026      PT LONG TERM GOAL #1   Title  Patient will be independent with HEP.    Time  6    Period  Weeks    Status  New    Target Date  01/28/18      PT LONG TERM GOAL #2   Title  Patient will improve hip strength MMT to 5/5 in all planes to improve stability during functional activities.    Time  6    Period  Weeks    Status  New    Target Date  01/28/18      PT LONG TERM GOAL #3   Title  Patient will report 0/10 pain during ADLs and recreational activities.    Time  6    Status  New    Target Date  01/28/18      PT LONG TERM GOAL #4   Title  Patient will demonstrate proper lifting mechanics to protect spine during work activities.    Period  Weeks    Target Date  01/28/18            Plan - 12/31/17 0944    Clinical Impression Statement  Patient tolerated today's treatment well with only fatigue sensation with all exercises. Patient able to complete exercises with proper draw in following VCs for technique. Good lifting technique noted with lifting from knee  height as well as from the floor. Patient denied any pain with exercises. Patient educated regarding the pivot technique following lifting as well. Increased fatigue sensation noted by patient with prone. 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    Rehab Potential  Excellent    PT Frequency  3x / week    PT Duration  6 weeks    PT Treatment/Interventions  Cryotherapy;Electrical Stimulation;Moist Heat;Therapeutic exercise;Therapeutic activities;Scar mobilization;Manual techniques;Patient/family education;Passive range of motion;ADLs/Self Care Home Management;Neuromuscular re-education;Stair training;Balance training    PT Next Visit Plan  begin on bike, core stabilization exercises, with respect to no bending lifitng protocol, modalities PRN for pain relief.    PT Home Exercise Plan  Draw ins and bridges    Consulted and Agree with Plan of Care  Patient       Patient will benefit from skilled therapeutic intervention in order to improve the following deficits and impairments:  Pain, Decreased activity tolerance, Decreased endurance, Decreased strength  Visit Diagnosis: Acute low back pain, unspecified back pain laterality, with sciatica presence unspecified     Problem List Patient Active Problem List   Diagnosis Date Noted  . Herniated intervertebral disc of lumbar spine 11/28/2017    Vincent Durham, PTA 12/31/2017, 10:25 AM  [facility] [address] [place], [place], [address] Phone: [phone]   Fax:  [phone]  Name: Vincent Durham MRN: [id number] Date of Birth: 03/27/1976

## 2018-01-04 ENCOUNTER — Encounter: Payer: Self-pay | Admitting: Physical Therapy

## 2018-01-04 ENCOUNTER — Ambulatory Visit: Payer: 59 | Admitting: Physical Therapy

## 2018-01-04 DIAGNOSIS — M545 Low back pain: Secondary | ICD-10-CM | POA: Diagnosis not present

## 2018-01-04 NOTE — Therapy (Signed)
[facility] [place], [place], [address] Phone: [phone]   Fax:  [phone]  Physical Therapy Treatment  Patient Details  Name: Vincent Durham MRN: [id number] Date of Birth: 19-Jun-1976 Referring Provider: Latanya Maudlin, MD   Encounter Date: 01/04/2018  PT End of Session - 01/04/18 0907    Visit Number  5    Number of Visits  18    Date for PT Re-Evaluation  01/28/18    Authorization Type  FOTO every 5th visit    PT Start Time  0905    PT Stop Time  0945    PT Time Calculation (min)  40 min    Activity Tolerance  Patient tolerated treatment well    Behavior During Therapy  [facility] for tasks assessed/performed       History reviewed. No pertinent past medical history.  Past Surgical History:  Procedure Laterality Date  . KNEE SURGERY Right   . KNEE SURGERY Left   . left wrist surgery     . LUMBAR LAMINECTOMY/DECOMPRESSION MICRODISCECTOMY Left 11/28/2017   Procedure: Central decompression lumbar laminectomy L4-L5 left;  Surgeon: Latanya Maudlin, MD;  Location: [facility];  Service: Orthopedics;  Laterality: Left;    There were no vitals filed for this visit.  Subjective Assessment - 01/04/18 0906    Subjective  Reports back pain as 3/10 today. Reports some pain in L low back now. Reports that he drove to [place] Tuesday which is a 3 hour drive but had to stop multiple times but was in his truck which had no lumbar support in the seat.    Limitations  House hold activities;Lifting    Diagnostic tests  MRI: ruptured disc at L4-L5    Patient Stated Goals  Get better, lift 70 lbs to return to work, return to recreational activities.    Currently in Pain?  Yes    Pain Score  3     Pain Location  Back    Pain Orientation  Lower    Pain Descriptors / Indicators  Discomfort    Pain Type  Surgical pain    Pain Onset  1 to 4 weeks ago         [facility] PT Assessment - 01/04/18 0001      Assessment   Medical  Diagnosis  s/p lumbar laminectomy    Onset Date/Surgical Date  11/28/17    Next MD Visit  01/18/2018    Prior Therapy  No      Precautions   Precautions  Back    Precaution Comments  No bending, lifting, or twisting.      Restrictions   Weight Bearing Restrictions  No                   [facility] Adult PT Treatment/Exercise - 01/04/18 0001      Lumbar Exercises: Stretches   Passive Hamstring Stretch  Right;3 reps;30 seconds    Single Knee to Chest Stretch  Right;3 reps;30 seconds    Lower Trunk Rotation  5 reps;20 seconds    Other Lumbar Stretch Exercise  SL R QL stretch atttempted with RLE with RUE overhead as well with limited stretch      Lumbar Exercises: Aerobic   Elliptical  L5, R5 x10 min      Lumbar Exercises: Machines for Strengthening   Cybex Knee Flexion  50# 3x10 reps with core    Leg Press  3.5# x30 reps with core  Other Lumbar Machine Exercise  Chest press 50# x30 reps  with core, Row 50# x30 reps    Other Lumbar Machine Exercise  Lat pulldown 50# x30 reps      Lumbar Exercises: Quadruped   Opposite Arm/Leg Raise  Right arm/Left leg;Left arm/Right leg;20 reps                  PT Long Term Goals - 01/04/18 0913      PT LONG TERM GOAL #1   Title  Patient will be independent with HEP.    Time  6    Period  Weeks    Status  Achieved      PT LONG TERM GOAL #2   Title  Patient will improve hip strength MMT to 5/5 in all planes to improve stability during functional activities.    Time  6    Period  Weeks    Status  On-going      PT LONG TERM GOAL #3   Title  Patient will report 0/10 pain during ADLs and recreational activities.    Time  6    Status  Partially Met Mopping difficult 01/04/2018      PT LONG TERM GOAL #4   Title  Patient will demonstrate proper lifting mechanics to protect spine during work activities.    Period  Weeks    Status  Achieved            Plan - 01/04/18 0957    Clinical Impression Statement  Patient  tolerated today's treatment fairly well as he reported low level LBP. Recently took a long ride a few days ago without lumbar support. Patient progressed with machine strengthening with core activation VCs. Patient also attempting machine workouts at home per patient report which has made him sore. Low back stretches attempted secondary to tightness reports in low back. Patient encouraged to continue [place] stretches more adamently at home.    Rehab Potential  Excellent    PT Frequency  3x / week    PT Duration  6 weeks    PT Treatment/Interventions  Cryotherapy;Electrical Stimulation;Moist Heat;Therapeutic exercise;Therapeutic activities;Scar mobilization;Manual techniques;Patient/family education;Passive range of motion;ADLs/Self Care Home Management;Neuromuscular re-education;Stair training;Balance training    PT Next Visit Plan  begin on bike, core stabilization exercises, with respect to no bending lifitng protocol, modalities PRN for pain relief.    PT Home Exercise Plan  Draw ins and bridges    Consulted and Agree with Plan of Care  Patient       Patient will benefit from skilled therapeutic intervention in order to improve the following deficits and impairments:  Pain, Decreased activity tolerance, Decreased endurance, Decreased strength  Visit Diagnosis: Acute low back pain, unspecified back pain laterality, with sciatica presence unspecified     Problem List Patient Active Problem List   Diagnosis Date Noted  . Herniated intervertebral disc of lumbar spine 11/28/2017    Standley Brooking, PTA 01/04/2018, 10:01 AM  [facility] [place], [place], [address] Phone: [phone]   Fax:  [phone]  Name: Vincent Durham MRN: [id number] Date of Birth: 11-Mar-1976

## 2018-01-14 ENCOUNTER — Encounter: Payer: Self-pay | Admitting: Physical Therapy

## 2018-01-14 ENCOUNTER — Ambulatory Visit: Payer: 59 | Admitting: Physical Therapy

## 2018-01-14 DIAGNOSIS — M545 Low back pain: Secondary | ICD-10-CM | POA: Diagnosis not present

## 2018-01-14 NOTE — Therapy (Signed)
[facility] [place], [place], [address] Phone: [phone]   Fax:  [phone]  Physical Therapy Treatment  Patient Details  Name: Vincent Durham MRN: [id number] Date of Birth: 12/17/1975 Referring Provider: Latanya Maudlin, MD   Encounter Date: 01/14/2018  PT End of Session - 01/14/18 1132    Visit Number  6    Number of Visits  18    Date for PT Re-Evaluation  01/28/18    Authorization Type  FOTO every 5th visit    PT Start Time  1118    PT Stop Time  1159    PT Time Calculation (min)  41 min    Activity Tolerance  Patient tolerated treatment well    Behavior During Therapy  [facility] for tasks assessed/performed       History reviewed. No pertinent past medical history.  Past Surgical History:  Procedure Laterality Date  . KNEE SURGERY Right   . KNEE SURGERY Left   . left wrist surgery     . LUMBAR LAMINECTOMY/DECOMPRESSION MICRODISCECTOMY Left 11/28/2017   Procedure: Central decompression lumbar laminectomy L4-L5 left;  Surgeon: Latanya Maudlin, MD;  Location: [facility];  Service: Orthopedics;  Laterality: Left;    There were no vitals filed for this visit.  Subjective Assessment - 01/14/18 1119    Subjective  Reports that he hasn't done anything too crazy with his sons but got on dirt bike with his son but didn't get crazy and denied pain.    Limitations  House hold activities;Lifting    Diagnostic tests  MRI: ruptured disc at L4-L5    Patient Stated Goals  Get better, lift 70 lbs to return to work, return to recreational activities.    Currently in Pain?  No/denies         [facility] PT Assessment - 01/14/18 0001      Assessment   Medical Diagnosis  s/p lumbar laminectomy    Onset Date/Surgical Date  11/28/17    Next MD Visit  01/18/2018    Prior Therapy  No      Precautions   Precautions  Back    Precaution Comments  No bending, lifting, or twisting.      Restrictions   Weight Bearing Restrictions  No       ROM / Strength   AROM / PROM / Strength  Strength      Strength   Overall Strength  Within functional limits for tasks performed    Strength Assessment Site  Hip    Right/Left Hip  Right;Left    Right Hip Flexion  5/5    Right Hip Extension  5/5    Right Hip External Rotation   5/5    Right Hip ABduction  5/5    Left Hip Flexion  5/5    Left Hip Extension  4+/5    Left Hip External Rotation  5/5    Left Hip ABduction  5/5                   [facility] Adult PT Treatment/Exercise - 01/14/18 0001      Lumbar Exercises: Aerobic   Elliptical  L5, R5 x10 min      Lumbar Exercises: Machines for Strengthening   Other Lumbar Machine Exercise  Chest press 40# 2x25 reps with core, Row 40# 2x25 reps    Other Lumbar Machine Exercise  Lat pulldown 40# 2x25 reps      Lumbar Exercises: Standing   Other Standing  Lumbar Exercises  B chop wood 50# x20 reps      Lumbar Exercises: Supine   Ab Set  20 reps;Other (comment) oblique ab curls x20 reps each    Bent Knee Raise  20 reps 90/90 march      Lumbar Exercises: Quadruped   Opposite Arm/Leg Raise  Right arm/Left leg;Left arm/Right leg;20 reps                  PT Long Term Goals - 01/14/18 1151      PT LONG TERM GOAL #1   Title  Patient will be independent with HEP.    Time  6    Period  Weeks    Status  Achieved      PT LONG TERM GOAL #2   Title  Patient will improve hip strength MMT to 5/5 in all planes to improve stability during functional activities.    Time  6    Period  Weeks    Status  Partially Met 5/5 throughout except for 4+/5 L ext 01/14/2018      PT LONG TERM GOAL #3   Title  Patient will report 0/10 pain during ADLs and recreational activities.    Time  6    Status  Achieved      PT LONG TERM GOAL #4   Title  Patient will demonstrate proper lifting mechanics to protect spine during work activities.    Period  Weeks    Status  Achieved            Plan - 01/14/18 1202    Clinical  Impression Statement  Patient tolerated today's well with only very minimal complaints of discomfort after tilling his garden this morning. Patient guided through more functional core strengthening with new oblique training. No complaints of pain with any exercises. Patient required minimal tactile cues for pelvic stability for QP exercise. B hip strength measured as 5/5 throughout except for L hip ext with 4+/5.    Rehab Potential  Excellent    PT Treatment/Interventions  Cryotherapy;Electrical Stimulation;Moist Heat;Therapeutic exercise;Therapeutic activities;Scar mobilization;Manual techniques;Patient/family education;Passive range of motion;ADLs/Self Care Home Management;Neuromuscular re-education;Stair training;Balance training;Visual/perceptual remediation/compensation    PT Next Visit Plan  Complete for HS training next treatment with MD note.    PT Home Exercise Plan  Draw ins and bridges    Consulted and Agree with Plan of Care  Patient       Patient will benefit from skilled therapeutic intervention in order to improve the following deficits and impairments:  Pain, Decreased activity tolerance, Decreased endurance, Decreased strength  Visit Diagnosis: Acute low back pain, unspecified back pain laterality, with sciatica presence unspecified     Problem List Patient Active Problem List   Diagnosis Date Noted  . Herniated intervertebral disc of lumbar spine 11/28/2017    Standley Brooking, PTA 01/14/2018, 12:11 PM  [facility] [place], [place], [address] Phone: [phone]   Fax:  [phone]  Name: GWIN EAGON MRN: [id number] Date of Birth: 06-09-1976

## 2018-01-21 ENCOUNTER — Ambulatory Visit: Payer: 59 | Admitting: Physical Therapy

## 2018-01-21 ENCOUNTER — Encounter: Payer: Self-pay | Admitting: Physical Therapy

## 2018-01-21 DIAGNOSIS — M545 Low back pain: Secondary | ICD-10-CM

## 2018-01-21 NOTE — Therapy (Signed)
[facility] [place], [place], [address] Phone: [phone]   Fax:  [phone]  Physical Therapy Treatment  Patient Details  Name: Vincent Durham MRN: [id number] Date of Birth: 25-Jun-1976 Referring Provider: Latanya Maudlin, MD   Encounter Date: 01/21/2018  PT End of Session - 01/21/18 0902    Visit Number  7    Number of Visits  18    Date for PT Re-Evaluation  01/28/18    Authorization Type  FOTO every 5th visit    PT Start Time  0900    PT Stop Time  0937    PT Time Calculation (min)  37 min    Activity Tolerance  Patient tolerated treatment well    Behavior During Therapy  [facility] for tasks assessed/performed       History reviewed. No pertinent past medical history.  Past Surgical History:  Procedure Laterality Date  . KNEE SURGERY Right   . KNEE SURGERY Left   . left wrist surgery     . LUMBAR LAMINECTOMY/DECOMPRESSION MICRODISCECTOMY Left 11/28/2017   Procedure: Central decompression lumbar laminectomy L4-L5 left;  Surgeon: Latanya Maudlin, MD;  Location: [facility];  Service: Orthopedics;  Laterality: Left;    There were no vitals filed for this visit.  Subjective Assessment - 01/21/18 0901    Subjective  Reports that MD released him to return to work after this week.    Limitations  House hold activities;Lifting    Diagnostic tests  MRI: ruptured disc at L4-L5    Patient Stated Goals  Get better, lift 70 lbs to return to work, return to recreational activities.    Currently in Pain?  No/denies         [facility] PT Assessment - 01/21/18 0001      Assessment   Medical Diagnosis  s/p lumbar laminectomy    Onset Date/Surgical Date  11/28/17    Next MD Visit  None    Prior Therapy  No      Precautions   Precautions  Back    Precaution Comments  No bending, lifting, or twisting.      Restrictions   Weight Bearing Restrictions  No      ROM / Strength   AROM / PROM / Strength  Strength      Strength    Overall Strength  Within functional limits for tasks performed    Strength Assessment Site  Hip    Right/Left Hip  Left    Left Hip Extension  5/5                   [facility] Adult PT Treatment/Exercise - 01/21/18 0001      Lumbar Exercises: Aerobic   Elliptical  L5, R5 x10 min      Lumbar Exercises: Machines for Strengthening   Other Lumbar Machine Exercise  Chest press 60# 2x25 reps with core, Row 60# 2x25 reps    Other Lumbar Machine Exercise  Lat pulldown 50# 2x25 reps      Lumbar Exercises: Standing   Other Standing Lumbar Exercises  B chop wood 50# x20 reps      Lumbar Exercises: Supine   Bridge  20 reps;5 seconds with red ball    Single Leg Bridge  10 reps BLE      Lumbar Exercises: Sidelying   Clam  Both;20 reps      Lumbar Exercises: Quadruped   Opposite Arm/Leg Raise  Right arm/Left leg;Left arm/Right leg;20 reps  PT Long Term Goals - 01/21/18 4540      PT LONG TERM GOAL #1   Title  Patient will be independent with HEP.    Time  6    Period  Weeks    Status  Achieved      PT LONG TERM GOAL #2   Title  Patient will improve hip strength MMT to 5/5 in all planes to improve stability during functional activities.    Time  6    Period  Weeks    Status  Achieved L hip ext 5/5 as of 01/21/2018      PT LONG TERM GOAL #3   Title  Patient will report 0/10 pain during ADLs and recreational activities.    Time  6    Status  Achieved      PT LONG TERM GOAL #4   Title  Patient will demonstrate proper lifting mechanics to protect spine during work activities.    Period  Weeks    Status  Achieved            Plan - 01/21/18 0942    Clinical Impression Statement  Patient tolerated today's treatment well with no complaints. Patient not worried in regards to any work and house activities. Patient guided through more progressive core and lumbar strengthening exercises without complaint. Patient challenged more with LLE SL bridge  today. Minimal pelvic instability noted with QP opp arm/leg. L hip ext MMT measured today as 5/5 without complaint of pain by patient. Patient elected to D/C from PT as he returns to work 01/28/2018.    Rehab Potential  Excellent    PT Frequency  3x / week    PT Duration  6 weeks    PT Treatment/Interventions  Cryotherapy;Electrical Stimulation;Moist Heat;Therapeutic exercise;Therapeutic activities;Scar mobilization;Manual techniques;Patient/family education;Passive range of motion;ADLs/Self Care Home Management;Neuromuscular re-education;Stair training;Balance training;Visual/perceptual remediation/compensation    PT Next Visit Plan  D/C summary required.    PT Home Exercise Plan  Draw ins and bridges    Consulted and Agree with Plan of Care  Patient       Patient will benefit from skilled therapeutic intervention in order to improve the following deficits and impairments:  Pain, Decreased activity tolerance, Decreased endurance, Decreased strength  Visit Diagnosis: Acute low back pain, unspecified back pain laterality, with sciatica presence unspecified     Problem List Patient Active Problem List   Diagnosis Date Noted  . Herniated intervertebral disc of lumbar spine 11/28/2017    Standley Brooking, PTA 01/21/18 10:30 AM    PHYSICAL THERAPY DISCHARGE SUMMARY  Visits from Start of Care: 7  Current functional level related to goals / functional outcomes: See above   Remaining deficits: See goals   Education / Equipment: HEP Plan: Patient agrees to discharge.  Patient goals were met. Patient is being discharged due to meeting the stated rehab goals.  ?????       [facility] [place], [place], [address] Phone: [phone]   Fax:  [phone]  Name: Vincent Durham MRN: [id number] Date of Birth: [birth year]

## 2018-06-14 ENCOUNTER — Encounter: Payer: 59 | Admitting: Family Medicine

## 2018-06-19 ENCOUNTER — Encounter: Payer: Self-pay | Admitting: Family Medicine

## 2018-06-19 ENCOUNTER — Ambulatory Visit (INDEPENDENT_AMBULATORY_CARE_PROVIDER_SITE_OTHER): Payer: 59 | Admitting: Family Medicine

## 2018-06-19 VITALS — BP 126/74 | HR 96 | Temp 98.1°F | Ht 74.0 in | Wt 211.0 lb

## 2018-06-19 DIAGNOSIS — Z130 Encounter for screening for diseases of the blood and blood-forming organs and certain disorders involving the immune mechanism: Secondary | ICD-10-CM

## 2018-06-19 DIAGNOSIS — Z13228 Encounter for screening for other metabolic disorders: Secondary | ICD-10-CM

## 2018-06-19 DIAGNOSIS — Z13220 Encounter for screening for lipoid disorders: Secondary | ICD-10-CM | POA: Diagnosis not present

## 2018-06-19 DIAGNOSIS — Z0000 Encounter for general adult medical examination without abnormal findings: Secondary | ICD-10-CM

## 2018-06-19 DIAGNOSIS — Z125 Encounter for screening for malignant neoplasm of prostate: Secondary | ICD-10-CM | POA: Diagnosis not present

## 2018-06-19 DIAGNOSIS — Z6827 Body mass index (BMI) 27.0-27.9, adult: Secondary | ICD-10-CM

## 2018-06-19 NOTE — Progress Notes (Signed)
Vincent Durham is a 42 y.o. male presents to office today for annual physical exam examination.    Concerns today include: 1. Left knee lump Patient reports a few month history of medial left knee lump.  He notes that it does not cause significant discomfort but he was wondering if it might have been related to his history of knee surgery.  He had an MCL repair a few years ago  Marital status: not married, Substance use: rare ETOH. No tobacco or drug use. Diet: fairly good when at home, Exercise: physically active at work Refills needed today: not on any medications Immunizations needed: Will get flu w/ work  Past Medical History:  Diagnosis Date  . Herniated intervertebral disc of lumbar spine 11/28/2017   Social History   Socioeconomic History  . Marital status: Single    Spouse name: Not on file  . Number of children: Not on file  . Years of education: Not on file  . Highest education level: Not on file  Occupational History  . Not on file  Social Needs  . Financial resource strain: Not on file  . Food insecurity:    Worry: Not on file    Inability: Not on file  . Transportation needs:    Medical: Not on file    Non-medical: Not on file  Tobacco Use  . Smoking status: Never Smoker  . Smokeless tobacco: Never Used  Substance and Sexual Activity  . Alcohol use: No  . Drug use: No  . Sexual activity: Yes    Birth control/protection: None    Comment: married 4 years, still sexually active with her   Lifestyle  . Physical activity:    Days per week: Not on file    Minutes per session: Not on file  . Stress: Not on file  Relationships  . Social connections:    Talks on phone: Not on file    Gets together: Not on file    Attends religious service: Not on file    Active member of club or organization: Not on file    Attends meetings of clubs or organizations: Not on file    Relationship status: Not on file  . Intimate partner violence:    Fear of current or ex  partner: Not on file    Emotionally abused: Not on file    Physically abused: Not on file    Forced sexual activity: Not on file  Other Topics Concern  . Not on file  Social History Narrative  . Not on file   Past Surgical History:  Procedure Laterality Date  . KNEE SURGERY Right   . KNEE SURGERY Left   . left wrist surgery     . LUMBAR LAMINECTOMY/DECOMPRESSION MICRODISCECTOMY Left 11/28/2017   Procedure: Central decompression lumbar laminectomy L4-L5 left;  Surgeon: Latanya Maudlin, MD;  Location: [facility];  Service: Orthopedics;  Laterality: Left;   Family History  Problem Relation Age of Onset  . Multiple sclerosis Mother   . Thyroid disease Mother   . Heart disease Mother   . Thyroid disease Sister   . Healthy Brother   . Healthy Son   . Colon cancer Neg Hx   . Prostate cancer Neg Hx   . Ovarian cancer Neg Hx    No current outpatient medications on file.  No Known Allergies   ROS: Review of Systems Constitutional: negative Eyes: negative Ears, nose, mouth, throat, and face: negative Respiratory: negative Cardiovascular: negative Gastrointestinal: negative Genitourinary:negative  Integument/breast: negative Hematologic/lymphatic: negative Musculoskeletal:see above. Neurological: negative Behavioral/Psych: negative Endocrine: negative Allergic/Immunologic: negative    Physical exam BP 126/74   Pulse 96   Temp 98.1 F (36.7 C) (Oral)   Ht _0  (1.88 m)   Wt 211 lb (95.7 kg)   BMI 27.09 kg/m  General appearance: alert, cooperative, appears stated age and no distress Head: Normocephalic, without obvious abnormality, atraumatic Eyes: negative findings: lids and lashes normal, conjunctivae and sclerae normal, corneas clear and pupils equal, round, reactive to light and accomodation Ears: normal TM's and external ear canals both ears Nose: Nares normal. Septum midline. Mucosa normal. No drainage or sinus tenderness. Throat: lips, mucosa, and tongue normal;  teeth and gums normal Neck: no adenopathy, no carotid bruit, no JVD, supple, symmetrical, trachea midline and thyroid not enlarged, symmetric, no tenderness/mass/nodules Back: symmetric, no curvature. ROM normal. No CVA tenderness. Lungs: clear to auscultation bilaterally Chest wall: no tenderness Heart: regular rate and rhythm, S1, S2 normal, no murmur, click, rub or gallop Abdomen: soft, non-tender; bowel sounds normal; no masses,  no organomegaly Extremities: extremities normal, atraumatic, no cyanosis or edema and small soft tissue swelling noted along posterior medial aspect of left knee. nontender. no associated erythema or induration. Pulses: 2+ and symmetric Skin: Skin color, texture, turgor normal. No rashes or lesions Lymph nodes: Cervical, supraclavicular, and axillary nodes normal. Neurologic: Alert and oriented X 3, normal strength and tone. Normal symmetric reflexes. Normal coordination and gait  Psych: Mood stable, speech normal, affect appropriate, pleasant and interactive. Depression screen [facility] 2/9 06/19/2018 09/05/2017 06/12/2017  Decreased Interest 0 0 0  Down, Depressed, Hopeless 0 0 0  PHQ - 2 Score 0 0 0     Assessment/ Plan: Vincent Durham here for annual physical exam.  Vincent Durham is a very pleasant 42 year old male with no active medical problems.  He had back surgery earlier this year for bulging disks.  He does endorse some stress during today's visit surrounding his mother's poor state of health, she has multiple sclerosis and is completely dependent for ADLs and iADLs.  1. Annual physical exam Well-appearing male.  No medical problems.  Will perform screening blood labs per his jobs request.  2. Screening for malignant neoplasm of prostate Currently asymptomatic.  Will obtain baseline PSA.  No known personal or family history of prostate, ovarian or breast cancers. - PSA  3. Screening, lipid - Lipid Panel  4. Screening, anemia, deficiency, iron - CBC  with Differential  5. Screening for metabolic disorder - KTG25+[facility]  6. BMI 27.0-27.9,adult  Reinforced healthy lifestyle choices, including diet (rich in fruits, vegetables and lean meats and low in salt and simple carbohydrates) and exercise (at least 30 minutes of moderate physical activity daily).  Patient to follow up in 1 year for annual exam or sooner if needed.  Lanie Schelling M. Lajuana Ripple, DO

## 2018-06-20 LAB — CBC WITH DIFFERENTIAL/PLATELET
Basophils Absolute: 0.1 10*3/uL (ref 0.0–0.2)
Basos: 1 %
EOS (ABSOLUTE): 0.1 10*3/uL (ref 0.0–0.4)
EOS: 1 %
HEMATOCRIT: 50.1 % (ref 37.5–51.0)
HEMOGLOBIN: 17.4 g/dL (ref 13.0–17.7)
IMMATURE GRANULOCYTES: 1 %
Immature Grans (Abs): 0.1 10*3/uL (ref 0.0–0.1)
Lymphocytes Absolute: 1.4 10*3/uL (ref 0.7–3.1)
Lymphs: 22 %
MCH: 29.3 pg (ref 26.6–33.0)
MCHC: 34.7 g/dL (ref 31.5–35.7)
MCV: 84 fL (ref 79–97)
Monocytes Absolute: 0.5 10*3/uL (ref 0.1–0.9)
Monocytes: 7 %
NEUTROS PCT: 68 %
Neutrophils Absolute: 4.5 10*3/uL (ref 1.4–7.0)
Platelets: 215 10*3/uL (ref 150–450)
RBC: 5.94 x10E6/uL — AB (ref 4.14–5.80)
RDW: 13.8 % (ref 12.3–15.4)
WBC: 6.6 10*3/uL (ref 3.4–10.8)

## 2018-06-20 LAB — CMP14+EGFR
ALBUMIN: 4.7 g/dL (ref 3.5–5.5)
ALK PHOS: 71 IU/L (ref 39–117)
ALT: 74 IU/L — ABNORMAL HIGH (ref 0–44)
AST: 44 IU/L — ABNORMAL HIGH (ref 0–40)
Albumin/Globulin Ratio: 1.7 (ref 1.2–2.2)
BILIRUBIN TOTAL: 1 mg/dL (ref 0.0–1.2)
BUN / CREAT RATIO: 14 (ref 9–20)
BUN: 18 mg/dL (ref 6–24)
CHLORIDE: 101 mmol/L (ref 96–106)
CO2: 25 mmol/L (ref 20–29)
Calcium: 9.7 mg/dL (ref 8.7–10.2)
Creatinine, Ser: 1.28 mg/dL — ABNORMAL HIGH (ref 0.76–1.27)
GFR calc Af Amer: 80 mL/min/{1.73_m2} (ref >59)
GFR calc non Af Amer: 70 mL/min/{1.73_m2} (ref >59)
GLOBULIN, TOTAL: 2.7 g/dL (ref 1.5–4.5)
GLUCOSE: 86 mg/dL (ref 65–99)
Potassium: 4 mmol/L (ref 3.5–5.2)
SODIUM: 140 mmol/L (ref 134–144)
Total Protein: 7.4 g/dL (ref 6.0–8.5)

## 2018-06-20 LAB — LIPID PANEL
CHOLESTEROL TOTAL: 169 mg/dL (ref 100–199)
Chol/HDL Ratio: 3.3 ratio (ref 0.0–5.0)
HDL: 51 mg/dL (ref >39)
LDL Calculated: 101 mg/dL — ABNORMAL HIGH (ref 0–99)
Triglycerides: 85 mg/dL (ref 0–149)
VLDL Cholesterol Cal: 17 mg/dL (ref 5–40)

## 2018-06-20 LAB — PSA: Prostate Specific Ag, Serum: 1.1 ng/mL (ref 0.0–4.0)

## 2018-11-11 ENCOUNTER — Ambulatory Visit (INDEPENDENT_AMBULATORY_CARE_PROVIDER_SITE_OTHER): Payer: 59 | Admitting: Family Medicine

## 2018-11-11 ENCOUNTER — Encounter: Payer: Self-pay | Admitting: Family Medicine

## 2018-11-11 VITALS — BP 125/81 | HR 71 | Temp 97.9°F | Ht 74.0 in | Wt 220.0 lb

## 2018-11-11 DIAGNOSIS — M25562 Pain in left knee: Secondary | ICD-10-CM | POA: Diagnosis not present

## 2018-11-11 DIAGNOSIS — M7122 Synovial cyst of popliteal space [Baker], left knee: Secondary | ICD-10-CM | POA: Diagnosis not present

## 2018-11-11 DIAGNOSIS — Z9889 Other specified postprocedural states: Secondary | ICD-10-CM | POA: Diagnosis not present

## 2018-11-11 MED ORDER — NAPROXEN 500 MG PO TABS: 500.0000 mg | ORAL_TABLET | Freq: Two times a day (BID) | ORAL | 0 refills | Status: DC

## 2018-11-11 NOTE — Patient Instructions (Addendum)
Baker Cyst  A Baker cyst, also called a popliteal cyst, is a sac-like growth that forms at the back of the knee. The cyst forms when the fluid-filled sac (bursa) that cushions the knee joint becomes enlarged. The bursa that becomes a Baker cyst is located at the back of the knee joint.  What are the causes?  In most cases, a Baker cyst results from another knee problem that causes swelling inside the knee. This makes the fluid inside the knee joint (synovial fluid) flow into the bursa behind the knee, causing the bursa to enlarge.  What increases the risk?  You may be more likely to develop a Baker cyst if you already have a knee problem, such as:   A tear in cartilage that cushions the knee joint (meniscal tear).   A tear in the tissues that connect the bones of the knee joint (ligament tear).   Knee swelling from osteoarthritis, rheumatoid arthritis, or gout.  What are the signs or symptoms?  A Baker cyst does not always cause symptoms. A lump behind the knee may be the only sign of the condition. The lump may be painful, especially when the knee is straightened. If the lump is painful, the pain may come and go. The knee may also be stiff.  Symptoms may quickly get more severe if the cyst breaks open (ruptures). If your cyst ruptures, signs and symptoms may affect the knee and the back of the lower leg (calf) and may include:   Sudden or worsening pain.   Swelling.   Bruising.  How is this diagnosed?  This condition may be diagnosed based on your symptoms and medical history. Your health care provider will also do a physical exam. This may include:   Feeling the cyst to check whether it is tender.   Checking your knee for signs of another knee condition that causes swelling.  You may have imaging tests, such as:   X-rays.   MRI.   Ultrasound.  How is this treated?  A Baker cyst that is not painful may go away without treatment. If the cyst gets large or painful, it will likely get better if the  underlying knee problem is treated.  Treatment for a Baker cyst may include:   Resting.   Keeping weight off of the knee. This means not leaning on the knee to support your body weight.   NSAIDs to reduce pain and swelling.   A procedure to drain the fluid from the cyst with a needle (aspiration). You may also get an injection of a medicine that reduces swelling (steroid).   Surgery. This may be needed if other treatments do not work. This usually involves correcting knee damage and removing the cyst.  Follow these instructions at home:   Take over-the-counter and prescription medicines only as told by your health care provider.   Rest and return to your normal activities as told by your health care provider. Avoid activities that make pain or swelling worse. Ask your health care provider what activities are safe for you.   Keep all follow-up visits as told by your health care provider. This is important.  Contact a health care provider if:   You have knee pain, stiffness, or swelling that does not get better.  Get help right away if:   You have sudden or worsening pain and swelling in your calf area.  This information is not intended to replace advice given to you by your health care provider. Make   sure you discuss any questions you have with your health care provider.  Document Released: 09/11/2005 Document Revised: 06/01/2016 Document Reviewed: 06/01/2016  [employer] Interactive Patient Education  2019 [employer]
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
## 2018-12-10 DIAGNOSIS — M7122 Synovial cyst of popliteal space [Baker], left knee: Secondary | ICD-10-CM | POA: Insufficient documentation

## 2018-12-10 DIAGNOSIS — M25562 Pain in left knee: Secondary | ICD-10-CM | POA: Insufficient documentation

## 2018-12-22 IMAGING — DX DG LUMBAR SPINE 2-3V
3 series · 3 of 3 positions shown · non-contrast
Comparison: 11/14/2017

CLINICAL DATA: Preop back surgery.

EXAM:
LUMBAR SPINE - 2-3 VIEW

[l-spine ap]
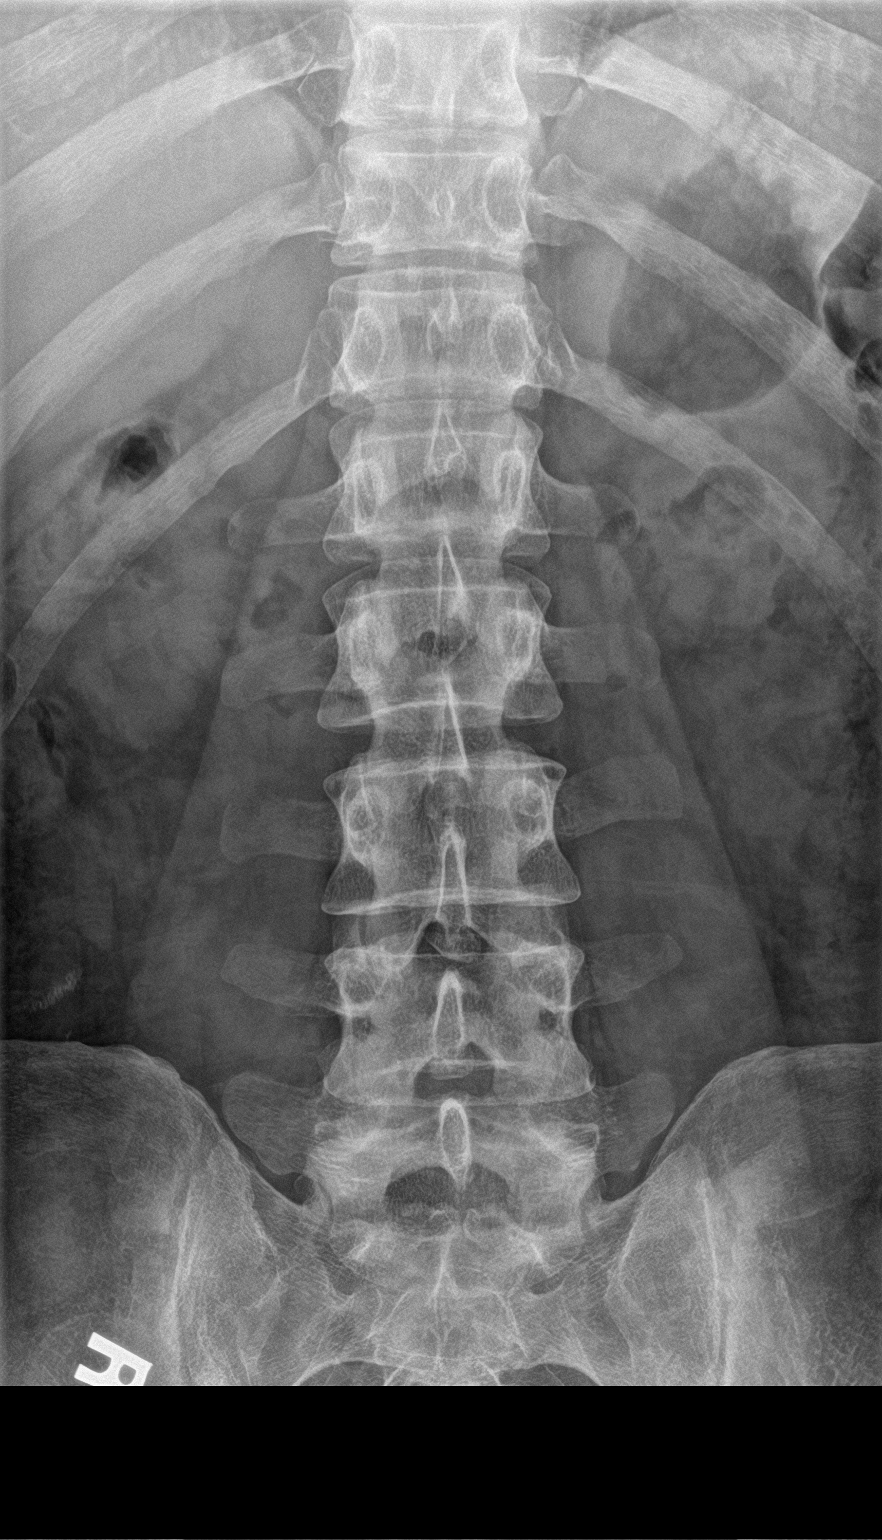

[l-spine lat]
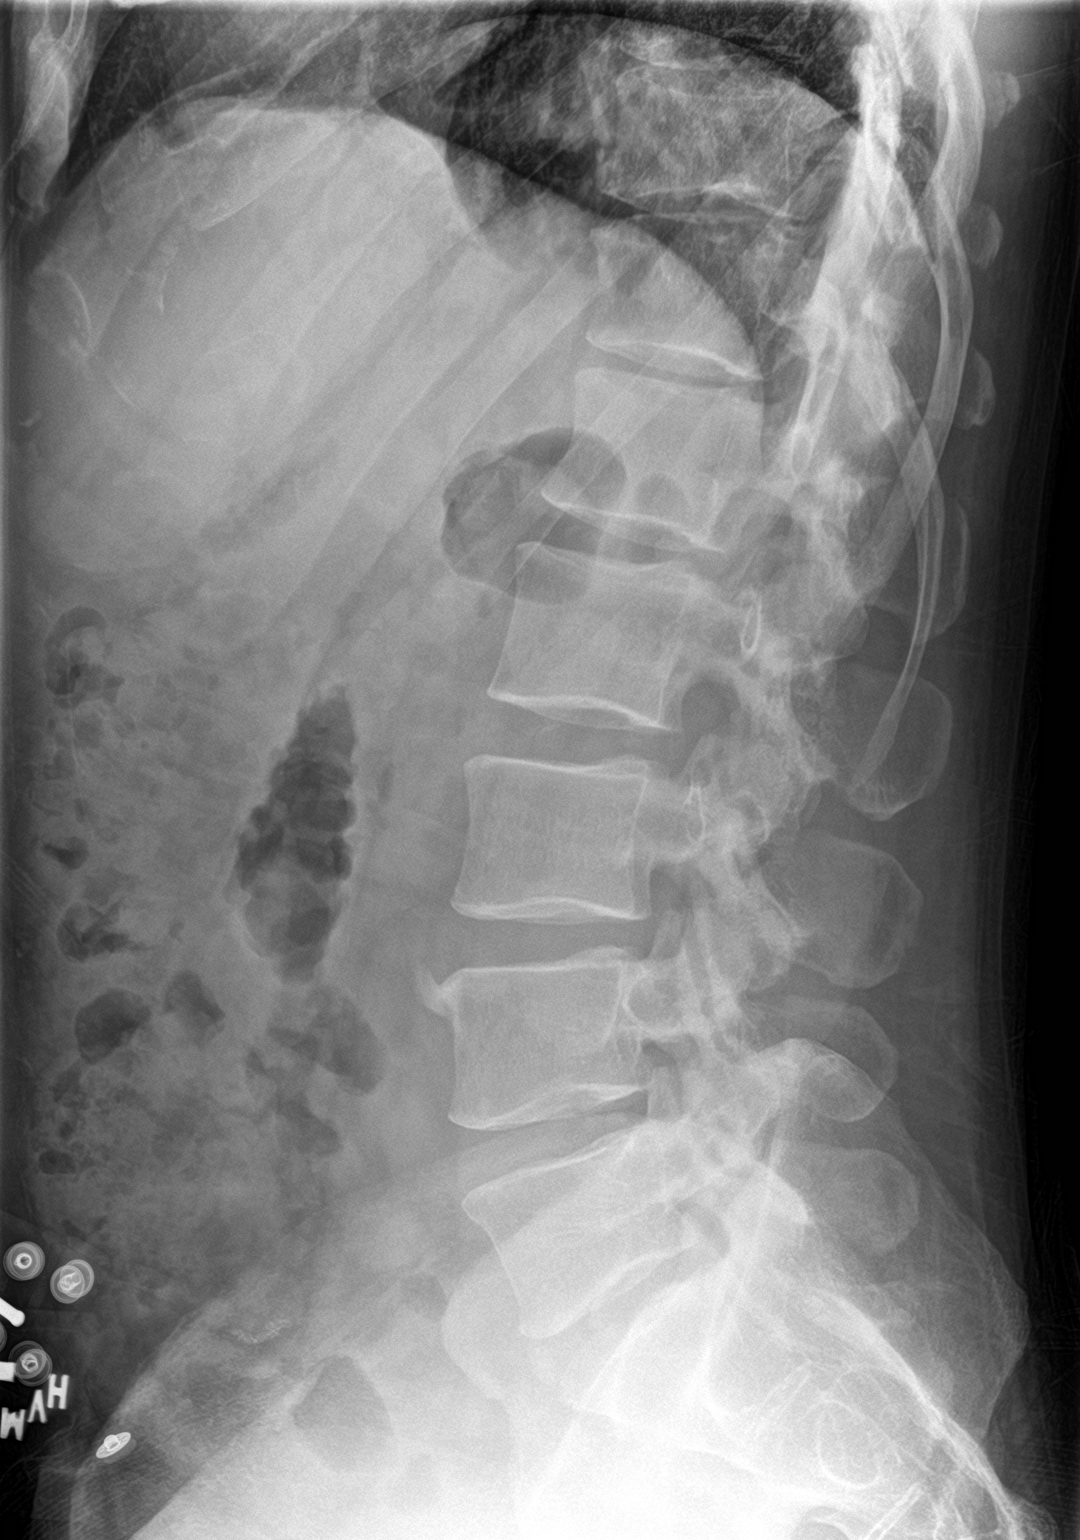

[l-spine spot]
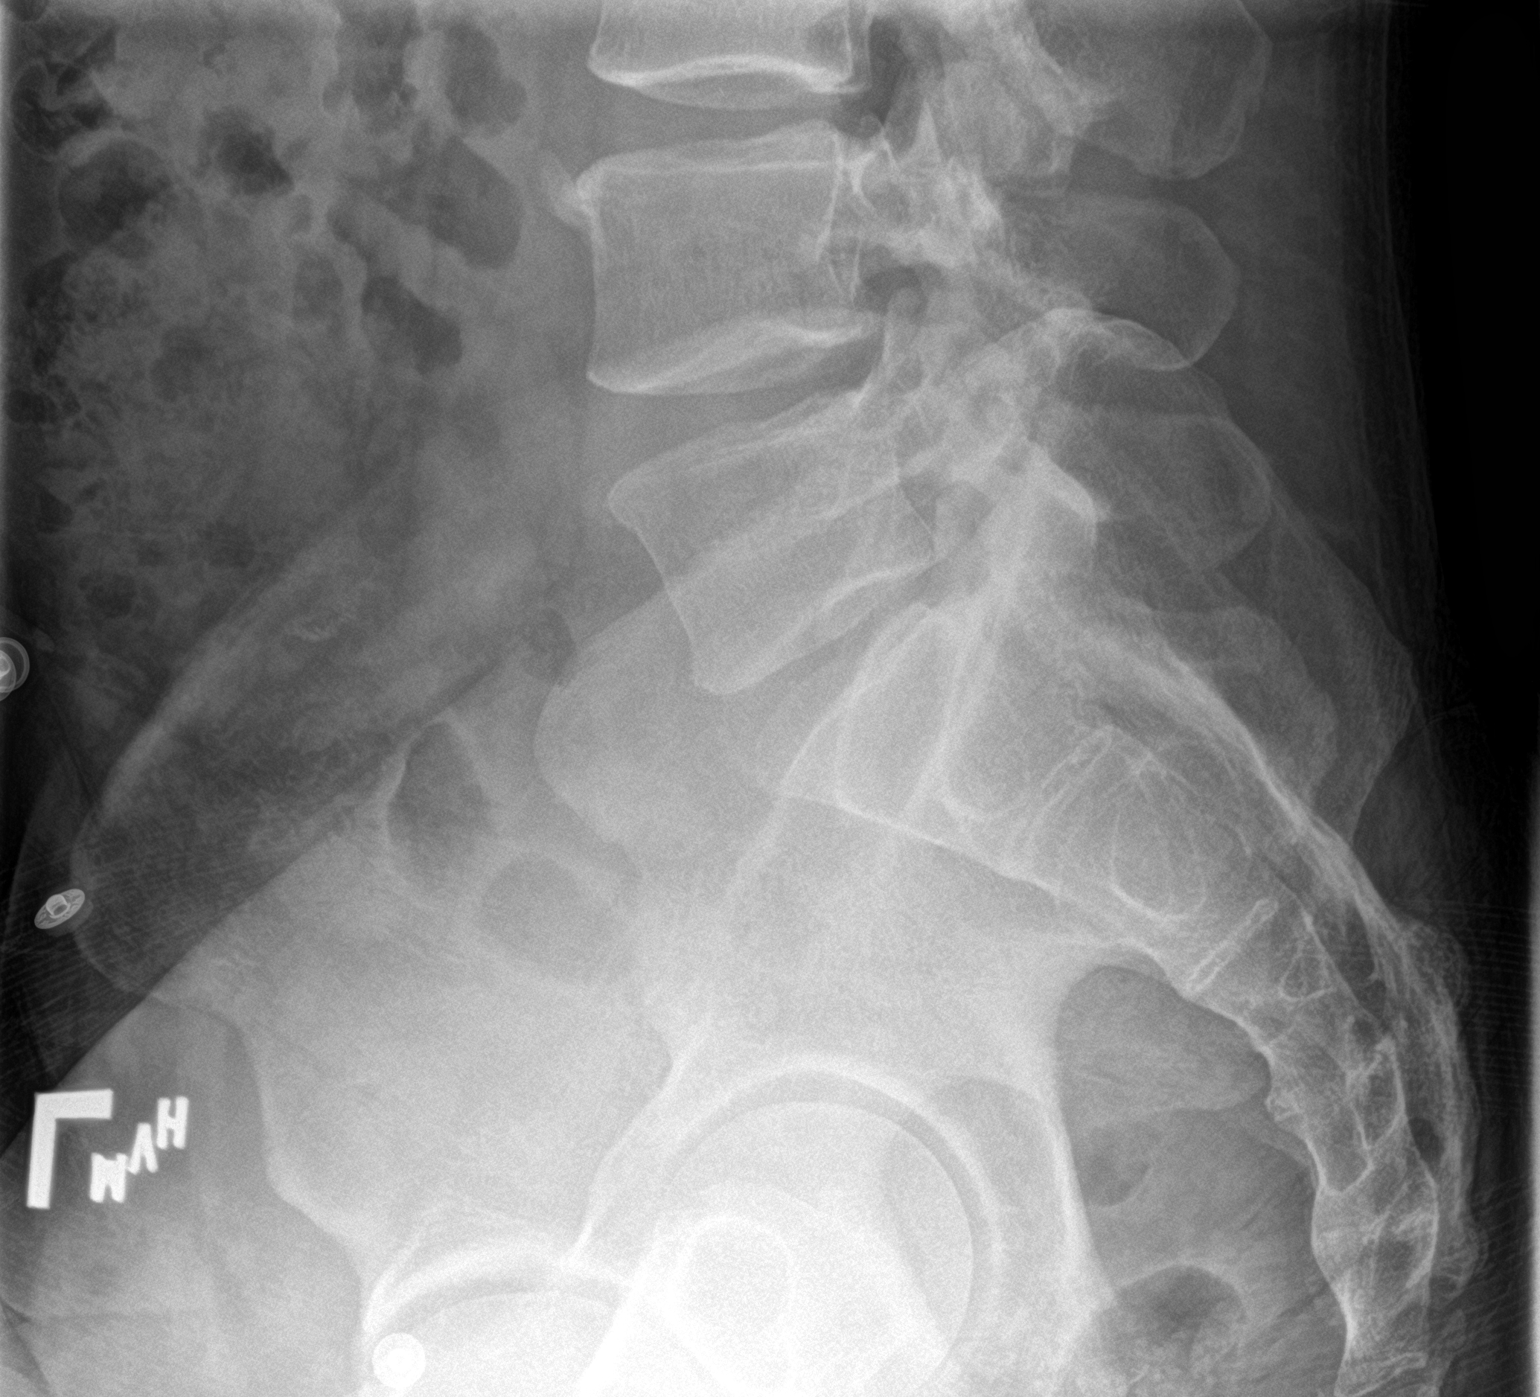

[3 of 3 positions shown; findings below may reference images not displayed]

FINDINGS: Five lumbar vertebra. These were labeled for preoperative planning
purposes.

Normal lumbar alignment. Negative for fracture or mass. No pars
defect. SI joints intact. Minimal anterior spurring L3-4. Otherwise
no significant disc space narrowing.
IMPRESSION: Five lumbar vertebral segments.  No acute abnormality.

## 2018-12-23 IMAGING — DX DG SPINE 1V PORT
1 series · 1 of 1 positions shown · non-contrast
Comparison: AP and lateral views of the lumbar spine November 27, 2017

CLINICAL DATA: Localization radiograph prior to L4-5 lumbar
laminectomy.

EXAM:
PORTABLE SPINE - 1 VIEW

[l-spine lat]
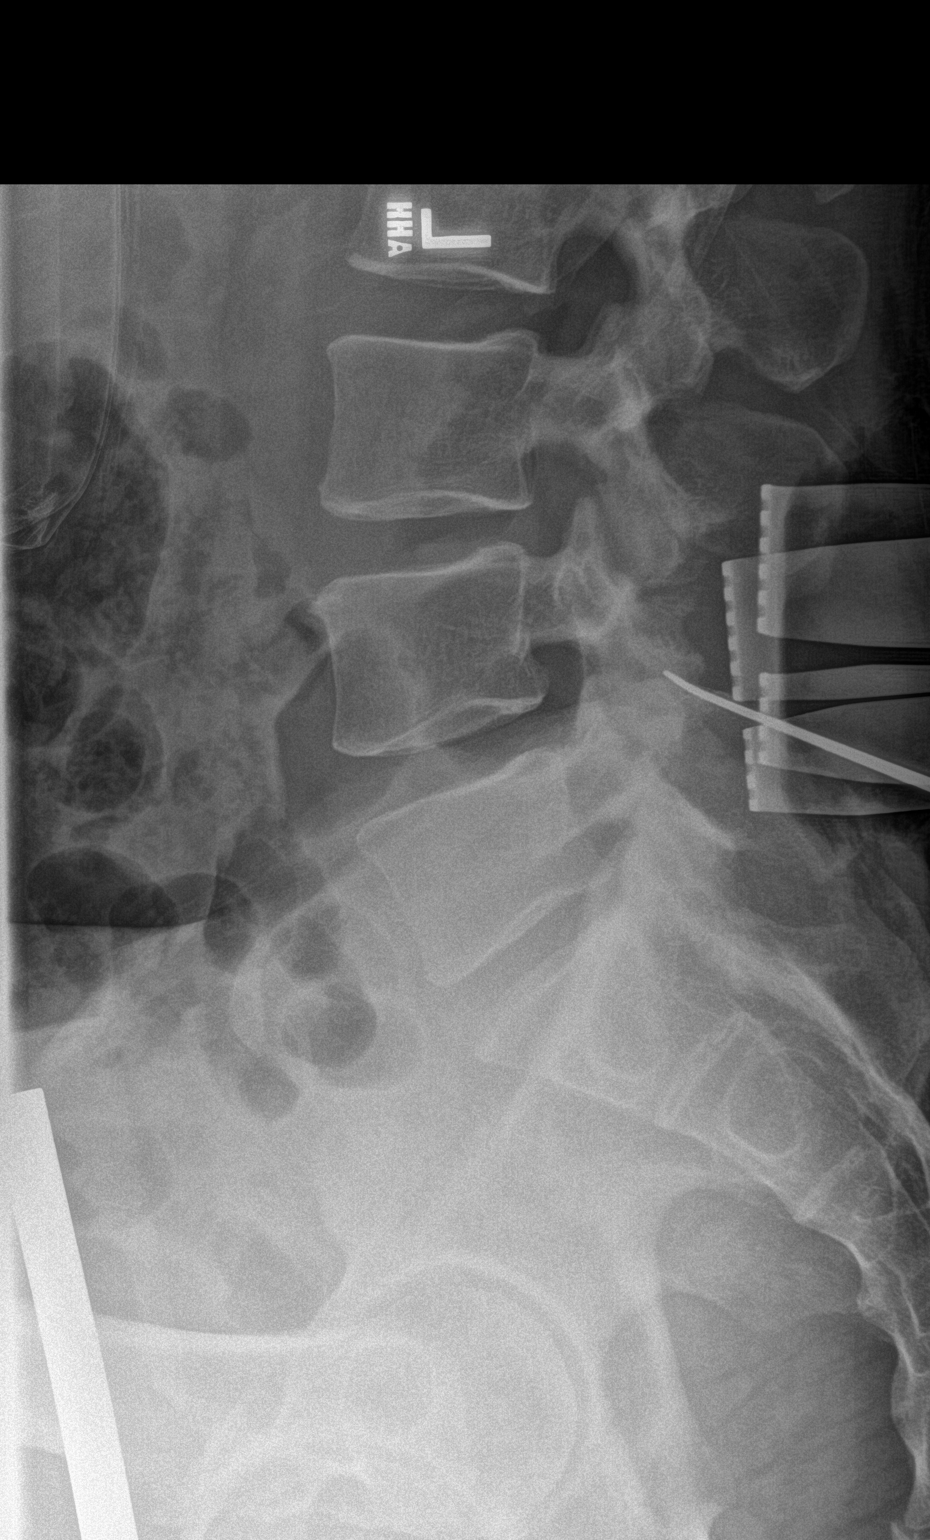

[1 of 1 positions shown; findings below may reference images not displayed]

FINDINGS: There is a tissue spreader device present overlying the L4-5 spinous
processes. A metallic probe projects over the lamina of L4 and is
approximately 2.8 cm posterior to the posterosuperior margin of the
body of L4.
IMPRESSION: Lateral localization radiograph of the lumbar spine with findings as
described above.

## 2019-08-04 ENCOUNTER — Ambulatory Visit (INDEPENDENT_AMBULATORY_CARE_PROVIDER_SITE_OTHER): Payer: 59 | Admitting: Family Medicine

## 2019-08-04 ENCOUNTER — Other Ambulatory Visit: Payer: Self-pay

## 2019-08-04 ENCOUNTER — Encounter: Payer: Self-pay | Admitting: Family Medicine

## 2019-08-04 VITALS — BP 118/75 | HR 74 | Temp 97.5°F | Ht 74.0 in | Wt 229.4 lb

## 2019-08-04 DIAGNOSIS — G959 Disease of spinal cord, unspecified: Secondary | ICD-10-CM | POA: Diagnosis not present

## 2019-08-04 MED ORDER — TIZANIDINE HCL 6 MG PO CAPS: 6.0000 mg | ORAL_CAPSULE | Freq: Three times a day (TID) | ORAL | 1 refills | Status: DC | PRN

## 2019-08-04 MED ORDER — DICLOFENAC SODIUM 75 MG PO TBEC: 75.0000 mg | DELAYED_RELEASE_TABLET | Freq: Two times a day (BID) | ORAL | 2 refills | Status: DC

## 2019-08-04 MED ORDER — BETAMETHASONE SOD PHOS & ACET 6 (3-3) MG/ML IJ SUSP
6.0000 mg | Freq: Once | INTRAMUSCULAR | Status: AC
  Administered 2019-08-04: 16:00:00 6 mg via INTRAMUSCULAR

## 2019-08-04 NOTE — Progress Notes (Addendum)
Subjective:  Patient ID: Vincent Durham, male    DOB: May 29, 1976  Age: 43 y.o. MRN: [id number]  CC: Back Pain (x 2 weeks- left)   HPI RAMERE DOWNS presents for recurrent low back pain. Had lumbar procedure 2 years ago with Dr. Gladstone Lighter. 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 He is chopping and stacking wood as well. NKI, but pain feels like it did before surgery and the surgery gave him complete relief. Currently pain is 6-8/10. Worse with sitting, bending. Dull ache at the left lower lumbar region.   Depression screen [facility] 2/9 08/04/2019 06/19/2018 09/05/2017  Decreased Interest 0 0 0  Down, Depressed, Hopeless 0 0 0  PHQ - 2 Score 0 0 0    History Ramona has a past medical history of Herniated intervertebral disc of lumbar spine (11/28/2017).   He has a past surgical history that includes Knee surgery (Right); Knee surgery (Left); left wrist surgery ; and Lumbar laminectomy/decompression microdiscectomy (Left, 11/28/2017).   His family history includes Healthy in his brother and son; Heart disease in his mother; Multiple sclerosis in his mother; Thyroid disease in his mother and sister.He reports that he has never smoked. He has never used smokeless tobacco. He reports that he does not drink alcohol or use drugs.    ROS Review of Systems  Constitutional: Negative for chills, diaphoresis and fever.  HENT: Negative for sore throat.   Respiratory: Negative for shortness of breath.   Cardiovascular: Negative for chest pain.  Gastrointestinal: Negative for abdominal pain.  Musculoskeletal: Positive for arthralgias, back pain, gait problem and myalgias. Negative for neck pain.  Skin: Negative for rash.  Neurological: Positive for weakness. Negative for numbness.    Objective:  BP 118/75   Pulse 74   Temp (!) 97.5 F (36.4 C) (Temporal)   Ht 6\' 2"  (1.88 m)   Wt 229 lb 6.4 oz (104.1 kg)   SpO2 97%   BMI 29.45 kg/m   BP Readings from Last 3  Encounters:  08/04/19 118/75  11/11/18 125/81  06/19/18 126/74    Wt Readings from Last 3 Encounters:  08/04/19 229 lb 6.4 oz (104.1 kg)  11/11/18 220 lb (99.8 kg)  06/19/18 211 lb (95.7 kg)     Physical Exam Vitals signs reviewed.  Constitutional:      General: He is in acute distress.     Appearance: He is well-developed.  HENT:     Head: Normocephalic.  Eyes:     Pupils: Pupils are equal, round, and reactive to light.  Neck:     Musculoskeletal: Normal range of motion.  Cardiovascular:     Rate and Rhythm: Normal rate and regular rhythm.     Heart sounds: Normal heart sounds. No murmur.  Pulmonary:     Effort: Pulmonary effort is normal.     Breath sounds: Normal breath sounds.  Abdominal:     Tenderness: There is no abdominal tenderness.  Musculoskeletal:        General: Tenderness (L5 to the left) present.  Skin:    General: Skin is warm and dry.  Neurological:     Mental Status: He is alert and oriented to person, place, and time.     Deep Tendon Reflexes: Reflexes are normal and symmetric.  Psychiatric:        Behavior: Behavior normal.        Thought Content: Thought content normal.       Assessment & Plan:  Dyland was seen today for back pain.  Diagnoses and all orders for this visit:  Lumbar myelopathy (HCC) -     Physical Therapy -     MR Lumbar Spine Wo Contrast; Future -     betamethasone acetate-betamethasone sodium phosphate (CELESTONE) injection 6 mg -     Ortho  Other orders -     diclofenac (VOLTAREN) 75 MG EC tablet; Take 1 tablet (75 mg total) by mouth 2 (two) times daily. For muscle and  Joint pain -     tizanidine (ZANAFLEX) 6 MG capsule; Take 1 capsule (6 mg total) by mouth 3 (three) times daily as needed for muscle spasms.       I have discontinued Dory Verdun. Austill's naproxen. I am also having him start on diclofenac and tizanidine. We administered betamethasone acetate-betamethasone sodium phosphate.  Allergies as of  08/04/2019   No Known Allergies     Medication List       Accurate as of August 04, 2019 11:59 PM. If you have any questions, ask your nurse or doctor.        STOP taking these medications   naproxen 500 MG tablet Commonly known as: Naprosyn Stopped by: Mechele Claude, MD     TAKE these medications   diclofenac 75 MG EC tablet Commonly known as: VOLTAREN Take 1 tablet (75 mg total) by mouth 2 (two) times daily. For muscle and  Joint pain Started by: Mechele Claude, MD   tizanidine 6 MG capsule Commonly known as: ZANAFLEX Take 1 capsule (6 mg total) by mouth 3 (three) times daily as needed for muscle spasms. Started by: Mechele Claude, MD        Follow-up: No follow-ups on file.  Mechele Claude, M.D.

## 2019-08-05 ENCOUNTER — Telehealth: Payer: Self-pay | Admitting: Family Medicine

## 2019-08-11 ENCOUNTER — Other Ambulatory Visit: Payer: Self-pay

## 2019-08-11 ENCOUNTER — Ambulatory Visit (HOSPITAL_COMMUNITY)
Admission: RE | Admit: 2019-08-11 | Discharge: 2019-08-11 | Disposition: A | Discharge status: Home / Self Care | Payer: 59 | Source: Ambulatory Visit | Attending: Family Medicine | Admitting: Family Medicine

## 2019-08-11 DIAGNOSIS — G959 Disease of spinal cord, unspecified: Secondary | ICD-10-CM | POA: Diagnosis present

## 2019-08-12 ENCOUNTER — Ambulatory Visit: Payer: 59 | Admitting: Physical Therapy

## 2019-08-18 ENCOUNTER — Encounter: Payer: Self-pay | Admitting: Family Medicine

## 2019-09-29 ENCOUNTER — Other Ambulatory Visit: Payer: Self-pay | Admitting: Family Medicine

## 2019-10-28 ENCOUNTER — Other Ambulatory Visit: Payer: Self-pay | Admitting: Family Medicine

## 2019-11-20 ENCOUNTER — Other Ambulatory Visit: Payer: Self-pay | Admitting: Family Medicine

## 2019-11-21 ENCOUNTER — Other Ambulatory Visit: Payer: Self-pay | Admitting: Family Medicine

## 2019-12-14 ENCOUNTER — Other Ambulatory Visit: Payer: Self-pay | Admitting: Family Medicine

## 2020-05-25 ENCOUNTER — Ambulatory Visit: Payer: 59 | Admitting: Nurse Practitioner

## 2020-07-05 ENCOUNTER — Encounter: Payer: Self-pay | Admitting: Family Medicine

## 2020-07-05 ENCOUNTER — Other Ambulatory Visit: Payer: Self-pay

## 2020-07-05 ENCOUNTER — Ambulatory Visit (INDEPENDENT_AMBULATORY_CARE_PROVIDER_SITE_OTHER): Payer: 59 | Admitting: Family Medicine

## 2020-07-05 VITALS — BP 115/70 | HR 106 | Temp 97.6°F | Ht 74.0 in | Wt 226.6 lb

## 2020-07-05 DIAGNOSIS — R7989 Other specified abnormal findings of blood chemistry: Secondary | ICD-10-CM

## 2020-07-05 DIAGNOSIS — Z0001 Encounter for general adult medical examination with abnormal findings: Secondary | ICD-10-CM | POA: Diagnosis not present

## 2020-07-05 DIAGNOSIS — Z566 Other physical and mental strain related to work: Secondary | ICD-10-CM | POA: Diagnosis not present

## 2020-07-05 DIAGNOSIS — Z125 Encounter for screening for malignant neoplasm of prostate: Secondary | ICD-10-CM

## 2020-07-05 DIAGNOSIS — E7800 Pure hypercholesterolemia, unspecified: Secondary | ICD-10-CM

## 2020-07-05 DIAGNOSIS — Z23 Encounter for immunization: Secondary | ICD-10-CM | POA: Diagnosis not present

## 2020-07-05 DIAGNOSIS — R718 Other abnormality of red blood cells: Secondary | ICD-10-CM | POA: Diagnosis not present

## 2020-07-05 DIAGNOSIS — Z0000 Encounter for general adult medical examination without abnormal findings: Secondary | ICD-10-CM

## 2020-07-05 NOTE — Patient Instructions (Addendum)
You had labs performed today.  You will be contacted with the results of the labs once they are available, usually in the next 3 business days for routine lab work.  If you have an active my chart account, they will be released to your MyChart.  If you prefer to have these labs released to you via telephone, please let us know.  If you had a pap smear or biopsy performed, expect to be contacted in about 7-10 days.   CONGRATS on your new endeavor.  I'm very excited for you!!!  Prayers for a nice, stress free transition!   Preventive Care 57-64 Years Old, Male Preventive care refers to lifestyle choices and visits with your health care provider that can promote health and wellness. This includes:  A yearly physical exam. This is also called an annual well check.  Regular dental and eye exams.  Immunizations.  Screening for certain conditions.  Healthy lifestyle choices, such as eating a healthy diet, getting regular exercise, not using drugs or products that contain nicotine and tobacco, and limiting alcohol use. What can I expect for my preventive care visit? Physical exam Your health care provider will check:  Height and weight. These may be used to calculate body mass index (BMI), which is a measurement that tells if you are at a healthy weight.  Heart rate and blood pressure.  Your skin for abnormal spots. Counseling Your health care provider may ask you questions about:  Alcohol, tobacco, and drug use.  Emotional well-being.  Home and relationship well-being.  Sexual activity.  Eating habits.  Work and work Statistician. What immunizations do I need?  Influenza (flu) vaccine  This is recommended every year. Tetanus, diphtheria, and pertussis (Tdap) vaccine  You may need a Td booster every 10 years. Varicella (chickenpox) vaccine  You may need this vaccine if you have not already been vaccinated. Zoster (shingles) vaccine  You may need this after age  62. Measles, mumps, and rubella (MMR) vaccine  You may need at least one dose of MMR if you were born in 1957 or later. You may also need a second dose. Pneumococcal conjugate (PCV13) vaccine  You may need this if you have certain conditions and were not previously vaccinated. Pneumococcal polysaccharide (PPSV23) vaccine  You may need one or two doses if you smoke cigarettes or if you have certain conditions. Meningococcal conjugate (MenACWY) vaccine  You may need this if you have certain conditions. Hepatitis A vaccine  You may need this if you have certain conditions or if you travel or work in places where you may be exposed to hepatitis A. Hepatitis B vaccine  You may need this if you have certain conditions or if you travel or work in places where you may be exposed to hepatitis B. Haemophilus influenzae type b (Hib) vaccine  You may need this if you have certain risk factors. Human papillomavirus (HPV) vaccine  If recommended by your health care provider, you may need three doses over 6 months. You may receive vaccines as individual doses or as more than one vaccine together in one shot (combination vaccines). Talk with your health care provider about the risks and benefits of combination vaccines. What tests do I need? Blood tests  Lipid and cholesterol levels. These may be checked every 5 years, or more frequently if you are over 22 years old.  Hepatitis C test.  Hepatitis B test. Screening  Lung cancer screening. You may have this screening every year starting at  age 8 if you have a 30-pack-year history of smoking and currently smoke or have quit within the past 15 years.  Prostate cancer screening. Recommendations will vary depending on your family history and other risks.  Colorectal cancer screening. All adults should have this screening starting at age 36 and continuing until age 65. Your health care provider may recommend screening at age 55 if you are at  increased risk. You will have tests every 1-10 years, depending on your results and the type of screening test.  Diabetes screening. This is done by checking your blood sugar (glucose) after you have not eaten for a while (fasting). You may have this done every 1-3 years.  Sexually transmitted disease (STD) testing. Follow these instructions at home: Eating and drinking  Eat a diet that includes fresh fruits and vegetables, whole grains, lean protein, and low-fat dairy products.  Take vitamin and mineral supplements as recommended by your health care provider.  Do not drink alcohol if your health care provider tells you not to drink.  If you drink alcohol: ? Limit how much you have to 0-2 drinks a day. ? Be aware of how much alcohol is in your drink. In the [place] one drink equals one 12 oz bottle of beer (355 mL), one 5 oz glass of wine (148 mL), or one 1 oz glass of hard liquor (44 mL). Lifestyle  Take daily care of your teeth and gums.  Stay active. Exercise for at least 30 minutes on 5 or more days each week.  Do not use any products that contain nicotine or tobacco, such as cigarettes, e-cigarettes, and chewing tobacco. If you need help quitting, ask your health care provider.  If you are sexually active, practice safe sex. Use a condom or other form of protection to prevent STIs (sexually transmitted infections).  Talk with your health care provider about taking a low-dose aspirin every day starting at age 67. What's next?  Go to your health care provider once a year for a well check visit.  Ask your health care provider how often you should have your eyes and teeth checked.  Stay up to date on all vaccines. This information is not intended to replace advice given to you by your health care provider. Make sure you discuss any questions you have with your health care provider. Document Revised: 09/05/2018 Document Reviewed: 09/05/2018 Elsevier Patient Education  2020  [employer].

## 2020-07-05 NOTE — Progress Notes (Signed)
Vincent Durham is a 44 y.o. male presents to office today for annual physical exam examination.    Concerns today include: 1.  None.  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  He does report feeling nervous about this but hopes that this will improve after this week.  Occupation: Owns a Freight forwarder, Substance use: None Diet: Good.  Adequate fiber and hydration, Exercise: Very physically active with work.  No structured exercise outside of work Refills needed today: N/A Immunizations needed: Influenza and tetanus due  Past Medical History:  Diagnosis Date  . Herniated intervertebral disc of lumbar spine 11/28/2017   Social History   Socioeconomic History  . Marital status: Divorced    Spouse name: Not on file  . Number of children: Not on file  . Years of education: Not on file  . Highest education level: Not on file  Occupational History  . 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 Smoking status: Never Smoker  . Smokeless tobacco: Never Used  Vaping Use  . Vaping Use: Never used  Substance and Sexual Activity  . Alcohol use: No  . Drug use: No  . Sexual activity: Yes    Birth control/protection: None  Other Topics Concern  . Not on file  Social History Narrative  . Not on file   Social Determinants of Health   Financial Resource Strain:   . Difficulty of Paying Living Expenses: Not on file  Food Insecurity:   . Worried About Charity fundraiser in the Last Year: Not on file  . Ran Out of Food in the Last Year: Not on file  Transportation Needs:   . Lack of Transportation (Medical): Not on file  . Lack of Transportation (Non-Medical): Not on file  Physical Activity:   . Days of Exercise per Week: Not on file  . Minutes  of Exercise per Session: Not on file  Stress:   . Feeling of Stress : Not on file  Social Connections:   . Frequency of Communication with Friends and Family: Not on file  . Frequency of Social Gatherings with Friends and Family: Not on file  . Attends Religious Services: Not on file  . Active Member of Clubs or Organizations: Not on file  . Attends Archivist Meetings: Not on file  . Marital Status: Not on file  Intimate Partner Violence:   . Fear of Current or Ex-Partner: Not on file  . Emotionally Abused: Not on file  . Physically Abused: Not on file  . Sexually Abused: Not on file   Past Surgical History:  Procedure Laterality Date  . KNEE SURGERY Right   . KNEE SURGERY Left   . left wrist surgery     . LUMBAR LAMINECTOMY/DECOMPRESSION MICRODISCECTOMY Left 11/28/2017   Procedure: Central decompression lumbar laminectomy L4-L5 left;  Surgeon: Vincent Maudlin, MD;  Location: [facility];  Service: Orthopedics;  Laterality: Left;   Family History  Problem Relation Age of Onset  . Multiple sclerosis Mother   . Thyroid disease Mother   . Heart disease Mother   . Thyroid disease Sister   . Healthy Brother   . Healthy Son   . Colon cancer Neg Hx   . Prostate cancer Neg Hx   .  Ovarian cancer Neg Hx    No current outpatient medications on file.  No Known Allergies   ROS: Review of Systems A comprehensive review of systems was negative.    Physical exam BP 115/70   Pulse (!) 106   Temp 97.6 F (36.4 C)   Ht 6' 2"  (1.88 m)   Wt 226 lb 9.6 oz (102.8 kg)   SpO2 95%   BMI 29.09 kg/m  General appearance: alert, cooperative, appears stated age and no distress Head: Normocephalic, without obvious abnormality, atraumatic Eyes: negative findings: lids and lashes normal, conjunctivae and sclerae normal, corneas clear and pupils equal, round, reactive to light and accomodation Ears: normal TM's and external ear canals both ears Nose: Nares normal. Septum midline. Mucosa  normal. No drainage or sinus tenderness. Throat: lips, mucosa, and tongue normal; teeth and gums normal Neck: no adenopathy, no carotid bruit, supple, symmetrical, trachea midline and thyroid not enlarged, symmetric, no tenderness/mass/nodules Back: symmetric, no curvature. ROM normal. No CVA tenderness. Lungs: clear to auscultation bilaterally Chest wall: no tenderness Heart: regular rate and rhythm, S1, S2 normal, no murmur, click, rub or gallop Abdomen: soft, non-tender; bowel sounds normal; no masses,  no organomegaly Extremities: extremities normal, atraumatic, no cyanosis or edema Pulses: 2+ and symmetric Skin: Skin color, texture, turgor normal. No rashes or lesions Lymph nodes: Cervical, supraclavicular, and axillary nodes normal. Neurologic: Alert and oriented X 3, normal strength and tone. Normal symmetric reflexes. Normal coordination and gait Psych: Mood stable, speech normal, affect appropriate, pleasant and interactive. Depression screen [facility] 2/9 07/05/2020 08/04/2019 06/19/2018  Decreased Interest 0 0 0  Down, Depressed, Hopeless 0 0 0  PHQ - 2 Score 0 0 0    Assessment/ Plan: Vincent Durham here for annual physical exam.   1. Annual physical exam Up-to-date on preventative care.  2. Work-related stress Currently transitioning to full-time landscaping business.  I encouraged him to follow-up with me if symptoms not improve after this transition occurs.  3. Elevated liver function tests Possibly related to hyperlipidemia.  Check LFTs - CMP14+EGFR  4. Elevated serum creatinine Repeat creatinine - CMP14+EGFR  5. Elevated red blood cell count - CBC  6. Pure hypercholesterolemia Check fasting lipid, LFTs and thyroid-stimulating hormone - CMP14+EGFR - Lipid Panel - TSH  7. Screening for malignant neoplasm of prostate Greater than 24 hours since last intercourse.  Check PSA.  Asymptomatic - PSA  8. Need for immunization against influenza Administered - Flu  Vaccine QUAD 36+ mos IM  9. Need for Tdap vaccination Administered - Tdap vaccine greater than or equal to 7yo IM   Handout on healthy lifestyle choices, including diet (rich in fruits, vegetables and lean meats and low in salt and simple carbohydrates) and exercise (at least 30 minutes of moderate physical activity daily).  Patient to follow up in 1 year for annual exam or sooner if needed.  Marnie Fazzino M. Lajuana Ripple, DO

## 2020-07-06 LAB — CMP14+EGFR
ALT: 61 IU/L — ABNORMAL HIGH (ref 0–44)
AST: 37 IU/L (ref 0–40)
Albumin/Globulin Ratio: 2.2 (ref 1.2–2.2)
Albumin: 4.8 g/dL (ref 4.0–5.0)
Alkaline Phosphatase: 84 IU/L (ref 44–121)
BUN/Creatinine Ratio: 11 (ref 9–20)
BUN: 12 mg/dL (ref 6–24)
Bilirubin Total: 0.9 mg/dL (ref 0.0–1.2)
CO2: 20 mmol/L (ref 20–29)
Calcium: 9.6 mg/dL (ref 8.7–10.2)
Chloride: 105 mmol/L (ref 96–106)
Creatinine, Ser: 1.11 mg/dL (ref 0.76–1.27)
GFR calc Af Amer: 94 mL/min/{1.73_m2} (ref >59)
GFR calc non Af Amer: 81 mL/min/{1.73_m2} (ref >59)
Globulin, Total: 2.2 g/dL (ref 1.5–4.5)
Glucose: 80 mg/dL (ref 65–99)
Potassium: 4.5 mmol/L (ref 3.5–5.2)
Sodium: 141 mmol/L (ref 134–144)
Total Protein: 7 g/dL (ref 6.0–8.5)

## 2020-07-06 LAB — CBC
Hematocrit: 53 % — ABNORMAL HIGH (ref 37.5–51.0)
Hemoglobin: 18.6 g/dL — ABNORMAL HIGH (ref 13.0–17.7)
MCH: 30.3 pg (ref 26.6–33.0)
MCHC: 35.1 g/dL (ref 31.5–35.7)
MCV: 86 fL (ref 79–97)
Platelets: 220 10*3/uL (ref 150–450)
RBC: 6.14 x10E6/uL — ABNORMAL HIGH (ref 4.14–5.80)
RDW: 12.1 % (ref 11.6–15.4)
WBC: 5.2 10*3/uL (ref 3.4–10.8)

## 2020-07-06 LAB — LIPID PANEL
Chol/HDL Ratio: 4.1 ratio (ref 0.0–5.0)
Cholesterol, Total: 186 mg/dL (ref 100–199)
HDL: 45 mg/dL (ref >39)
LDL Chol Calc (NIH): 116 mg/dL — ABNORMAL HIGH (ref 0–99)
Triglycerides: 140 mg/dL (ref 0–149)
VLDL Cholesterol Cal: 25 mg/dL (ref 5–40)

## 2020-07-06 LAB — TSH: TSH: 2.44 u[IU]/mL (ref 0.450–4.500)

## 2020-07-06 LAB — PSA: Prostate Specific Ag, Serum: 0.9 ng/mL (ref 0.0–4.0)

## 2020-07-07 ENCOUNTER — Telehealth: Payer: Self-pay

## 2020-07-07 NOTE — Telephone Encounter (Signed)
Pt returned missed call from [facility] regarding lab results. Reviewed results with pt per Dr Jeannett Senior notes. Pt voiced understanding. Pt said he does not want to be referred to see Hematologist. Advised pt to call back and let us know if he changed his mind or had any questions regarding results.

## 2021-09-09 ENCOUNTER — Ambulatory Visit (INDEPENDENT_AMBULATORY_CARE_PROVIDER_SITE_OTHER): Payer: Self-pay | Admitting: Family Medicine

## 2021-09-09 ENCOUNTER — Encounter: Payer: Self-pay | Admitting: Family Medicine

## 2021-09-09 DIAGNOSIS — J358 Other chronic diseases of tonsils and adenoids: Secondary | ICD-10-CM

## 2021-09-09 DIAGNOSIS — Z8709 Personal history of other diseases of the respiratory system: Secondary | ICD-10-CM

## 2021-09-09 DIAGNOSIS — J029 Acute pharyngitis, unspecified: Secondary | ICD-10-CM

## 2021-09-09 MED ORDER — AMOXICILLIN 875 MG PO TABS: 875.0000 mg | ORAL_TABLET | Freq: Two times a day (BID) | ORAL | 0 refills | Status: AC

## 2021-09-09 NOTE — Progress Notes (Signed)
Virtual Visit via telephone note Due to COVID-19 pandemic this visit was conducted virtually. This visit type was conducted due to national recommendations for restrictions regarding the COVID-19 Pandemic (e.g. social distancing, sheltering in place) in an effort to limit this patient's exposure and mitigate transmission in our community. All issues noted in this document were discussed and addressed.  A physical exam was not performed with this format.   I connected with Vincent Durham on 09/09/2021 at 1630 by telephone and verified that I am speaking with the correct person using two identifiers. Vincent Durham is currently located at home and family is currently with them during visit. The provider, Kari Baars, FNP is located in their office at time of visit.  I discussed the limitations, risks, security and privacy concerns of performing an evaluation and management service by virtual visit and the availability of in person appointments. I also discussed with the patient that there may be a patient responsible charge related to this service. The patient expressed understanding and agreed to proceed.  Subjective:  Patient ID: Vincent Durham, male    DOB: 07-Jan-1976, 45 y.o.   MRN: [id number]  Chief Complaint:  Sore Throat   HPI: Vincent Durham is a 45 y.o. male presenting on 09/09/2021 for Sore Throat   Patient reports sore throat, pharyngeal and tonsillar erythema, white patches on tonsils, and difficulty swallowing.  Onset of symptoms yesterday, worsening today.  No other URI symptoms.  Has had a history of strep in the past.  No known recent exposures.  Sore Throat  This is a new problem. The current episode started yesterday. The problem has been rapidly worsening. Neither side of throat is experiencing more pain than the other. The pain is at a severity of 7/10. The pain is moderate. Associated symptoms include swollen glands and trouble swallowing. Pertinent negatives  include no abdominal pain, congestion, coughing, diarrhea, drooling, ear discharge, ear pain, headaches, hoarse voice, plugged ear sensation, neck pain, shortness of breath, stridor or vomiting. He has tried acetaminophen for the symptoms. The treatment provided no relief.    Relevant past medical, surgical, family, and social history reviewed and updated as indicated.  Allergies and medications reviewed and updated.   Past Medical History:  Diagnosis Date   Herniated intervertebral disc of lumbar spine 11/28/2017    Past Surgical History:  Procedure Laterality Date   KNEE SURGERY Right    KNEE SURGERY Left    left wrist surgery      LUMBAR LAMINECTOMY/DECOMPRESSION MICRODISCECTOMY Left 11/28/2017   Procedure: Central decompression lumbar laminectomy L4-L5 left;  Surgeon: Ranee Gosselin, MD;  Location: [facility];  Service: Orthopedics;  Laterality: Left;    Social History   Socioeconomic History   Marital status: Divorced    Spouse name: Not on file   Number of children: Not on file   Years of education: Not on file   Highest education level: Not on file  Occupational History   Occupation: Entrepreneur    Comment: 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  Tobacco Use   Smoking status: Never   Smokeless tobacco: Never  Vaping Use   Vaping Use: Never used  Substance and Sexual Activity   Alcohol use: No   Drug use: No   Sexual activity: Yes    Birth control/protection: None  Other Topics Concern   Not on file  Social History Narrative   Not on file   Social Determinants of Health  Financial Resource Strain: Not on file  Food Insecurity: Not on file  Transportation Needs: Not on file  Physical Activity: Not on file  Stress: Not on file  Social Connections: Not on file  Intimate Partner Violence: Not on file    Outpatient Encounter Medications as of 09/09/2021  Medication Sig   amoxicillin (AMOXIL) 875 MG tablet Take 1 tablet (875 mg  total) by mouth 2 (two) times daily for 10 days.   No facility-administered encounter medications on file as of 09/09/2021.    No Known Allergies  Review of Systems  Constitutional:  Positive for chills and fever. Negative for activity change, appetite change, diaphoresis, fatigue and unexpected weight change.  HENT:  Positive for sore throat and trouble swallowing. Negative for congestion, dental problem, drooling, ear discharge, ear pain, facial swelling, hearing loss, hoarse voice, mouth sores, nosebleeds, postnasal drip, rhinorrhea, sinus pressure, sinus pain, sneezing, tinnitus and voice change.   Respiratory:  Negative for cough, shortness of breath and stridor.   Cardiovascular:  Negative for chest pain.  Gastrointestinal:  Negative for abdominal pain, diarrhea and vomiting.  Genitourinary:  Negative for decreased urine volume.  Musculoskeletal:  Negative for arthralgias, myalgias and neck pain.  Neurological:  Negative for weakness and headaches.  Psychiatric/Behavioral:  Negative for confusion.   All other systems reviewed and are negative.       Observations/Objective: No vital signs or physical exam, this was a virtual health encounter.  Pt alert and oriented, answers all questions appropriately, and able to speak in full sentences.    Assessment and Plan: Vincent Durham was seen today for sore throat.  Diagnoses and all orders for this visit:  Sore throat Tonsillar exudate History of strep pharyngitis Symptoms concerning for strep pharyngitis. CENTOR criteria 4.  No associated URI symptoms.  History of strep in the past.  Will treat with amoxicillin as prescribed.  Tylenol as needed for fever and pain control.  Report any new, worsening, or persistent symptoms.  Follow-up as needed. -     amoxicillin (AMOXIL) 875 MG tablet; Take 1 tablet (875 mg total) by mouth 2 (two) times daily for 10 days.    Follow Up Instructions: Return if symptoms worsen or fail to improve.     I discussed the assessment and treatment plan with the patient. The patient was provided an opportunity to ask questions and all were answered. The patient agreed with the plan and demonstrated an understanding of the instructions.   The patient was advised to call back or seek an in-person evaluation if the symptoms worsen or if the condition fails to improve as anticipated.  The above assessment and management plan was discussed with the patient. The patient verbalized understanding of and has agreed to the management plan. Patient is aware to call the clinic if they develop any new symptoms or if symptoms persist or worsen. Patient is aware when to return to the clinic for a follow-up visit. Patient educated on when it is appropriate to go to the emergency department.    I provided 15 minutes of time during this telephone encounter.   Kari Baars, FNP-C [facility] [address] [place], [place] [address] [phone] 09/09/2021
